# Patient Record
Sex: Male | Born: 1970 | Race: White | Hispanic: No | Smoking: Current every day smoker
Health system: Southern US, Community
[De-identification: ages and names within clinical notes are randomized; demographics above are authoritative.]

## PROBLEM LIST (undated history)

## (undated) DIAGNOSIS — M1990 Unspecified osteoarthritis, unspecified site: Secondary | ICD-10-CM

## (undated) DIAGNOSIS — F419 Anxiety disorder, unspecified: Secondary | ICD-10-CM

## (undated) HISTORY — PX: NO PAST SURGERIES: SHX2092

## (undated) HISTORY — DX: Anxiety disorder, unspecified: F41.9

## (undated) HISTORY — PX: JOINT REPLACEMENT: SHX530

---

## 2004-11-22 ENCOUNTER — Emergency Department: Payer: Self-pay | Admitting: Emergency Medicine

## 2006-05-19 ENCOUNTER — Other Ambulatory Visit: Payer: Self-pay

## 2006-05-19 ENCOUNTER — Inpatient Hospital Stay: Payer: Self-pay | Admitting: Internal Medicine

## 2006-05-20 ENCOUNTER — Other Ambulatory Visit: Payer: Self-pay

## 2006-05-21 ENCOUNTER — Other Ambulatory Visit: Payer: Self-pay

## 2006-05-23 ENCOUNTER — Ambulatory Visit: Payer: Self-pay | Admitting: Family Medicine

## 2007-08-16 ENCOUNTER — Ambulatory Visit: Payer: Self-pay | Admitting: Physician Assistant

## 2008-11-24 ENCOUNTER — Ambulatory Visit: Payer: Self-pay | Admitting: Family Medicine

## 2016-04-18 ENCOUNTER — Emergency Department: Payer: Self-pay

## 2016-04-18 ENCOUNTER — Emergency Department
Admission: EM | Admit: 2016-04-18 | Discharge: 2016-04-18 | Disposition: A | Discharge status: Home / Self Care | Payer: Self-pay | Attending: Emergency Medicine | Admitting: Emergency Medicine

## 2016-04-18 DIAGNOSIS — Y929 Unspecified place or not applicable: Secondary | ICD-10-CM | POA: Insufficient documentation

## 2016-04-18 DIAGNOSIS — F17210 Nicotine dependence, cigarettes, uncomplicated: Secondary | ICD-10-CM | POA: Insufficient documentation

## 2016-04-18 DIAGNOSIS — X501XXA Overexertion from prolonged static or awkward postures, initial encounter: Secondary | ICD-10-CM | POA: Insufficient documentation

## 2016-04-18 DIAGNOSIS — Y999 Unspecified external cause status: Secondary | ICD-10-CM | POA: Insufficient documentation

## 2016-04-18 DIAGNOSIS — S86911A Strain of unspecified muscle(s) and tendon(s) at lower leg level, right leg, initial encounter: Secondary | ICD-10-CM | POA: Insufficient documentation

## 2016-04-18 DIAGNOSIS — Y939 Activity, unspecified: Secondary | ICD-10-CM | POA: Insufficient documentation

## 2016-04-18 MED ORDER — ONDANSETRON 4 MG PO TBDP
4.0000 mg | ORAL_TABLET | Freq: Once | ORAL | Status: AC
  Administered 2016-04-18: 4 mg via ORAL
  Filled 2016-04-18: qty 1

## 2016-04-18 MED ORDER — IBUPROFEN 800 MG PO TABS: 800.0000 mg | ORAL_TABLET | Freq: Three times a day (TID) | ORAL | 0 refills | Status: DC | PRN

## 2016-04-18 MED ORDER — HYDROMORPHONE HCL 1 MG/ML IJ SOLN
1.0000 mg | Freq: Once | INTRAMUSCULAR | Status: AC
  Administered 2016-04-18: 1 mg via INTRAMUSCULAR
  Filled 2016-04-18: qty 1

## 2016-04-18 MED ORDER — HYDROCODONE-ACETAMINOPHEN 5-325 MG PO TABS: 1.0000 | ORAL_TABLET | ORAL | 0 refills | Status: DC | PRN

## 2016-04-18 NOTE — ED Notes (Signed)
See triage note. States he stepped in a hole yesterday and fell  Having pain to front of knee and into anterior lower leg

## 2016-04-18 NOTE — ED Provider Notes (Signed)
[facility] Emergency Department Provider Note  ____________________________________________  Time seen: Approximately 11:18 AM  I have reviewed the triage vital signs and the nursing notes.   HISTORY  Chief Complaint Knee Pain    HPI Sean Blake is a 45 y.o. male Libby Maw for evaluation of left knee pain. Patient states that he stepped in hole twisting his knee. Difficult time sleeping last night secondary to the pain. Scrubbed his pain is 10 over 10 at this time.   History reviewed. No pertinent past medical history.  There are no active problems to display for this patient.   History reviewed. No pertinent surgical history.  Prior to Admission medications   Medication Sig Start Date End Date Taking? Authorizing Provider  HYDROcodone-acetaminophen (NORCO) 5-325 MG tablet Take 1-2 tablets by mouth every 4 (four) hours as needed for moderate pain. 04/18/16   Pierce Crane Winslow Ederer, PA-C  ibuprofen (ADVIL,MOTRIN) 800 MG tablet Take 1 tablet (800 mg total) by mouth every 8 (eight) hours as needed. 04/18/16   Arlyss Repress, PA-C    Allergies Review of patient's allergies indicates no known allergies.  No family history on file.  Social History Social History  Substance Use Topics  . Smoking status: Current Every Day Smoker    Types: Cigarettes  . Smokeless tobacco: Never Used  . Alcohol use Yes    Review of Systems Constitutional: No fever/chills Cardiovascular: Denies chest pain. Respiratory: Denies shortness of breath. Musculoskeletal: Positive for left knee pain. Skin: Negative for rash. Neurological: Negative for headaches, focal weakness or numbness.  10-point ROS otherwise negative.  ____________________________________________   PHYSICAL EXAM:  VITAL SIGNS: ED Triage Vitals  Enc Vitals Group     BP 04/18/16 1036 130/74     Pulse Rate 04/18/16 1036 79     Resp 04/18/16 1036 18     Temp 04/18/16 1036 98.3 F (36.8 C)     Temp  Source 04/18/16 1036 Oral     SpO2 04/18/16 1036 98 %     Weight 04/18/16 1036 280 lb (127 kg)     Height 04/18/16 1036 6' (1.829 m)     Head Circumference --      Peak Flow --      Pain Score 04/18/16 1037 8     Pain Loc --      Pain Edu? --      Excl. in [place]? --     Constitutional: Alert and oriented. Well appearing and in no acute distress.  Cardiovascular: Normal rate, regular rhythm. Grossly normal heart sounds.  Good peripheral circulation. Respiratory: Normal respiratory effort.  No retractions. Lungs CTAB. Musculoskeletal: Positive for left knee tenderness. Limited range of motion. Increased pain with flexion. Distally neurovascularly intact. No ecchymosis or bruising noted no obvious effusion. Neurologic:  Normal speech and language. No gross focal neurologic deficits are appreciated. No gait instability. Skin:  Skin is warm, dry and intact. No rash noted. Psychiatric: Mood and affect are normal. Speech and behavior are normal.  ____________________________________________   LABS (all labs ordered are listed, but only abnormal results are displayed)  Labs Reviewed - No data to display ____________________________________________  EKG   ____________________________________________  RADIOLOGY  IMPRESSION:  Tricompartmental degenerative changes, advanced for age.    No acute fracture or joint effusion.    Suspect ossified loose bodies in the anterior joint space.    ____________________________________________   PROCEDURES  Procedure(s) performed: None  Critical Care performed: No  ____________________________________________   INITIAL IMPRESSION /  ASSESSMENT AND PLAN / ED COURSE  Pertinent labs & imaging results that were available during my care of the patient were reviewed by me and considered in my medical decision making (see chart for details). Review of the [place] CSRS was performed in accordance of the [place] prior to dispensing any controlled  drugs.  Acute left knee strain with severe osteoarthritis throughout the knee. Patient follow-up with orthopedics on call Rx given for ibuprofen 800 mg 3 times a day, and Norco and crutches. Recommend patient use ice and elevation and with PACs 24 hours.  Clinical Course    ____________________________________________   FINAL CLINICAL IMPRESSION(S) / ED DIAGNOSES  Final diagnoses:  Knee strain, right, initial encounter     This chart was dictated using voice recognition software/Dragon. Despite best efforts to proofread, errors can occur which can change the meaning. Any change was purely unintentional.    Arlyss Repress, PA-C 04/18/16 1209    Earleen Newport, MD 04/18/16 1359

## 2016-04-18 NOTE — ED Triage Notes (Signed)
Pt states he stepped in a hole yesterday and twisted his left knee.

## 2016-04-18 NOTE — Discharge Instructions (Signed)
Use your knee immobilizer at home.

## 2017-09-21 ENCOUNTER — Encounter (HOSPITAL_COMMUNITY): Payer: Self-pay | Admitting: Family Medicine

## 2017-09-21 ENCOUNTER — Ambulatory Visit (HOSPITAL_COMMUNITY)
Admission: EM | Admit: 2017-09-21 | Discharge: 2017-09-21 | Disposition: A | Discharge status: Home / Self Care | Payer: BLUE CROSS/BLUE SHIELD | Attending: Family Medicine | Admitting: Family Medicine

## 2017-09-21 ENCOUNTER — Ambulatory Visit (INDEPENDENT_AMBULATORY_CARE_PROVIDER_SITE_OTHER): Payer: BLUE CROSS/BLUE SHIELD

## 2017-09-21 DIAGNOSIS — M25562 Pain in left knee: Secondary | ICD-10-CM | POA: Diagnosis not present

## 2017-09-21 DIAGNOSIS — M1712 Unilateral primary osteoarthritis, left knee: Secondary | ICD-10-CM

## 2017-09-21 HISTORY — DX: Unspecified osteoarthritis, unspecified site: M19.90

## 2017-09-21 MED ORDER — DICLOFENAC SODIUM 75 MG PO TBEC: 75.0000 mg | DELAYED_RELEASE_TABLET | Freq: Two times a day (BID) | ORAL | 0 refills | Status: AC

## 2017-09-21 MED ORDER — HYDROCODONE-ACETAMINOPHEN 5-325 MG PO TABS: 1.0000 | ORAL_TABLET | Freq: Four times a day (QID) | ORAL | 0 refills | Status: DC | PRN

## 2017-09-21 NOTE — Discharge Instructions (Addendum)
Xray shows signs of worsening arthritis. Follow up with ortho for further management of arthritis.  Try taking diclofenac twice daily and see if you have any better relief compared to meloxicam. You may supplement with Tylenol. I have given a few days of Norco for you to use only for severe pain. Follow up with Dr. Scarlette Calico for continued pain medicine/Pain specialist for you knee injection.   Use ice and elevation to help with swelling. Use knee sleeve for support as needed.

## 2017-09-21 NOTE — ED Triage Notes (Signed)
Pt here for left knee pain x 2 weeks. Reports swelling and painful to walk and sleep. Hx of arthritis. No injury. Takes meloxicam for arthritis.

## 2017-09-21 NOTE — ED Provider Notes (Signed)
[place]    CSN: [id number] Arrival date & time: 09/21/17  1157     History   Chief Complaint Chief Complaint  Patient presents with  . Knee Pain    HPI Sean Blake is a 47 y.o. male presenting with acute flare of chronic knee pain. He has had knee pain from arthritis for years. He normally takes mobic for arthritis. Xray and 2017 showing tricompartmental degenerative changes, advanced for his age also some loose bodies.  Patient works in [employer] and has normal aches and pains in his knees.  Over the past 2 weeks he has had increasing swelling and pain.  Hurts to walk.  Had to leave work today due to pain.  His PCP is Dr. streets in [place] who has set him up with [facility].  He has seen them and plans to have a cortisone injection into his knee on February 15. denies any new recent injury to the knee.  Trying to elevate knee at home.  Meloxicam not providing much relief.  HPI  Past Medical History:  Diagnosis Date  . Arthritis     There are no active problems to display for this patient.   History reviewed. No pertinent surgical history.     Home Medications    Prior to Admission medications   Medication Sig Start Date End Date Taking? Authorizing Provider  diclofenac (VOLTAREN) 75 MG EC tablet Take 1 tablet (75 mg total) by mouth 2 (two) times daily for 12 days. 09/21/17 10/03/17  Xayla Puzio C, PA-C  HYDROcodone-acetaminophen (NORCO) 5-325 MG tablet Take 1-2 tablets by mouth every 6 (six) hours as needed for moderate pain. 09/21/17   Lucien Budney C, PA-C  ibuprofen (ADVIL,MOTRIN) 800 MG tablet Take 1 tablet (800 mg total) by mouth every 8 (eight) hours as needed. 04/18/16   Beers, Pierce Crane, PA-C    Family History History reviewed. No pertinent family history.  Social History Social History   Tobacco Use  . Smoking status: Current Every Day Smoker    Types: Cigarettes  . Smokeless tobacco: Never Used  Substance Use  Topics  . Alcohol use: Yes  . Drug use: Not on file     Allergies   Patient has no known allergies.   Review of Systems Review of Systems  Constitutional: Negative for fatigue and fever.  Respiratory: Negative for shortness of breath.   Cardiovascular: Negative for chest pain.  Gastrointestinal: Negative for nausea and vomiting.  Musculoskeletal: Positive for arthralgias, gait problem and joint swelling. Negative for back pain and myalgias.  Skin: Negative for wound.  Neurological: Negative for dizziness, light-headedness and headaches.     Physical Exam Triage Vital Signs ED Triage Vitals  Enc Vitals Group     BP 09/21/17 1225 (!) 147/89     Pulse Rate 09/21/17 1225 77     Resp 09/21/17 1225 18     Temp 09/21/17 1225 98 F (36.7 C)     Temp src --      SpO2 09/21/17 1225 97 %     Weight --      Height --      Head Circumference --      Peak Flow --      Pain Score 09/21/17 1224 9     Pain Loc --      Pain Edu? --      Excl. in [place]? --    No data found.  Updated Vital Signs BP (!) 147/89  Pulse 77   Temp 98 F (36.7 C)   Resp 18   SpO2 97%    Physical Exam  Constitutional: He appears well-developed and well-nourished.  HENT:  Head: Normocephalic and atraumatic.  Eyes: Conjunctivae are normal.  Neck: Neck supple.  Cardiovascular: Normal rate.  Pulmonary/Chest: Effort normal. No respiratory distress.  Musculoskeletal:  No obvious swelling or deformity, no erythema or warmth to touch.  Patient was full range of motion although with pain.     UC Treatments / Results  Labs (all labs ordered are listed, but only abnormal results are displayed) Labs Reviewed - No data to display  EKG  EKG Interpretation None       Radiology Dg Knee Complete 4 Views Left  Result Date: 09/21/2017 CLINICAL DATA:  47 year old male with 2 weeks of left knee pain, unable to bear weight. No known injury. Arthritis. Medial pain. EXAM: LEFT KNEE - COMPLETE 4+ VIEW  COMPARISON:  04/18/2016. FINDINGS: Supine views. Chronic calcified loose bodies along the anterior joint space near midline appear mildly larger than in 2017, individually estimated at 9 millimeters. Tricompartmental degenerative spurring is chronic. Medial joint space loss and subchondral heterogeneity has progressed. Possible patellofemoral compartment chondrocalcinosis, new since 2017. No joint effusion identified. No acute osseous abnormality identified. IMPRESSION: 1. Severe tricompartmental degenerative changes at the left knee have progressed since 2017. 2. 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 Chronic calcified loose bodies. Possible new patellofemoral compartment chondrocalcinosis. 3.  No acute osseous abnormality identified. Electronically Signed   By: Genevie Ann M.D.   On: 09/21/2017 12:45    Procedures Procedures (including critical care time)  Medications Ordered in UC Medications - No data to display   Initial Impression / Assessment and Plan / UC Course  I have reviewed the triage vital signs and the nursing notes.  Pertinent labs & imaging results that were available during my care of the patient were reviewed by me and considered in my medical decision making (see chart for details).     X-ray showing progressing arthritis plus osteochondral defects.  Advised patient to continue with cortisone injection on February 15.  Also advised to follow-up with orthopedics to start discussions on possible need for replacement versus continuation of injection therapy.  For pain today we will try changing to diclofenac versus the current Mobic.  Patient in significant pain will provide a few Norco for severe pain only.  Advised not to use at work as this will cause sedation.  Knee sleeve provided.  Work note provided  Final Clinical Impressions(s) / UC Diagnoses   Final diagnoses:  Osteoarthritis of left knee, unspecified osteoarthritis type    ED Discharge Orders         Ordered    HYDROcodone-acetaminophen (NORCO) 5-325 MG tablet  Every 6 hours PRN,   Status:  Discontinued     09/21/17 1316    diclofenac (VOLTAREN) 75 MG EC tablet  2 times daily     09/21/17 1316    HYDROcodone-acetaminophen (NORCO) 5-325 MG tablet  Every 6 hours PRN     09/21/17 1316       Controlled Substance Prescriptions [place] Controlled Substance Registry consulted? Yes, I have consulted the [place] Controlled Substances Registry for this patient, and feel the risk/benefit ratio today is favorable for proceeding with this prescription for a controlled substance.   Janith Lima, [place] 09/21/17 2130

## 2018-05-29 ENCOUNTER — Encounter (HOSPITAL_COMMUNITY): Payer: Self-pay | Admitting: Emergency Medicine

## 2018-05-29 ENCOUNTER — Emergency Department (HOSPITAL_COMMUNITY)
Admission: EM | Admit: 2018-05-29 | Discharge: 2018-05-29 | Disposition: A | Discharge status: Home / Self Care | Payer: BLUE CROSS/BLUE SHIELD | Attending: Emergency Medicine | Admitting: Emergency Medicine

## 2018-05-29 ENCOUNTER — Emergency Department (HOSPITAL_COMMUNITY): Payer: BLUE CROSS/BLUE SHIELD

## 2018-05-29 DIAGNOSIS — R1030 Lower abdominal pain, unspecified: Secondary | ICD-10-CM | POA: Diagnosis present

## 2018-05-29 DIAGNOSIS — F17210 Nicotine dependence, cigarettes, uncomplicated: Secondary | ICD-10-CM | POA: Diagnosis not present

## 2018-05-29 DIAGNOSIS — K5900 Constipation, unspecified: Secondary | ICD-10-CM | POA: Diagnosis not present

## 2018-05-29 LAB — URINALYSIS, ROUTINE W REFLEX MICROSCOPIC
Bilirubin Urine: NEGATIVE
Glucose, UA: NEGATIVE mg/dL
Leukocytes, UA: NEGATIVE
NITRITE: NEGATIVE
Protein, ur: 30 mg/dL — AB
SPECIFIC GRAVITY, URINE: 1.025 (ref 1.005–1.030)
pH: 6 (ref 5.0–8.0)

## 2018-05-29 LAB — CBC
HCT: 44.1 % (ref 39.0–52.0)
HEMOGLOBIN: 14.9 g/dL (ref 13.0–17.0)
MCH: 32 pg (ref 26.0–34.0)
MCHC: 33.8 g/dL (ref 30.0–36.0)
MCV: 94.6 fL (ref 78.0–100.0)
Platelets: 287 10*3/uL (ref 150–400)
RBC: 4.66 MIL/uL (ref 4.22–5.81)
RDW: 12.5 % (ref 11.5–15.5)
WBC: 5.8 10*3/uL (ref 4.0–10.5)

## 2018-05-29 LAB — COMPREHENSIVE METABOLIC PANEL
ALBUMIN: 3.5 g/dL (ref 3.5–5.0)
ALK PHOS: 54 U/L (ref 38–126)
ALT: 23 U/L (ref 0–44)
ANION GAP: 9 (ref 5–15)
AST: 23 U/L (ref 15–41)
BILIRUBIN TOTAL: 0.6 mg/dL (ref 0.3–1.2)
BUN: 13 mg/dL (ref 6–20)
CALCIUM: 9 mg/dL (ref 8.9–10.3)
CO2: 24 mmol/L (ref 22–32)
Chloride: 103 mmol/L (ref 98–111)
Creatinine, Ser: 0.83 mg/dL (ref 0.61–1.24)
GFR calc non Af Amer: >60 mL/min (ref >60)
Glucose, Bld: 97 mg/dL (ref 70–99)
Potassium: 3.4 mmol/L — ABNORMAL LOW (ref 3.5–5.1)
Sodium: 136 mmol/L (ref 135–145)
TOTAL PROTEIN: 6.6 g/dL (ref 6.5–8.1)

## 2018-05-29 LAB — URINALYSIS, MICROSCOPIC (REFLEX): BACTERIA UA: NONE SEEN

## 2018-05-29 LAB — POC OCCULT BLOOD, ED: FECAL OCCULT BLD: NEGATIVE

## 2018-05-29 LAB — LIPASE, BLOOD: LIPASE: 42 U/L (ref 11–51)

## 2018-05-29 MED ORDER — POLYETHYLENE GLYCOL 3350 17 G PO PACK: 17.0000 g | PACK | Freq: Every day | ORAL | 0 refills | Status: DC

## 2018-05-29 NOTE — ED Triage Notes (Signed)
Patient c/o abdominal pain states he has been having issues with his bowels over the past week. Was able to have a small bowel movement Saturday. Abdomen is slightly distended. Denies any N/V.

## 2018-05-29 NOTE — ED Provider Notes (Signed)
[place] [facility] EMERGENCY DEPARTMENT Provider Note   CSN: [id number] Arrival date & time: 05/29/18  1016     History   Chief Complaint Chief Complaint  Patient presents with  . Constipation    HPI Sean Blake is a 47 y.o. male.  The history is provided by the patient. No language interpreter was used.  Constipation   This is a new problem. Associated symptoms include abdominal pain. He does not exercise regularly. There has been adequate water intake. He has tried nothing for the symptoms. The treatment provided no relief. Risk factors include immobility.  Pt reports he has bilat knee problems.  Pt states some decrease in activity.  He is on meloxicam.  Pt reports he is constipated.  Pt saw some blood with trying to have a bowel movement.  Pt concerned about blood.   Past Medical History:  Diagnosis Date  . Arthritis     There are no active problems to display for this patient.   History reviewed. No pertinent surgical history.      Home Medications    Prior to Admission medications   Medication Sig Start Date End Date Taking? Authorizing Provider  meloxicam (MOBIC) 15 MG tablet Take 15 mg by mouth daily. 05/28/18  Yes [provider]  polyethylene glycol (MIRALAX) packet Take 17 g by mouth daily. 05/29/18   Fransico Meadow, PA-C    Family History No family history on file.  Social History Social History   Tobacco Use  . Smoking status: Current Every Day Smoker    Types: Cigarettes  . Smokeless tobacco: Never Used  Substance Use Topics  . Alcohol use: Yes  . Drug use: Not on file     Allergies   Patient has no known allergies.   Review of Systems Review of Systems  Gastrointestinal: Positive for abdominal pain and constipation.  All other systems reviewed and are negative.    Physical Exam Updated Vital Signs BP (!) 141/87 (BP Location: Right Arm)   Pulse 84   Temp 98.4 F (36.9 C) (Oral)   Resp 16   Ht 6' (1.829  m)   Wt 126.1 kg   SpO2 99%   BMI 37.70 kg/m   Physical Exam  Constitutional: He appears well-developed and well-nourished.  HENT:  Head: Normocephalic and atraumatic.  Eyes: Conjunctivae are normal.  Neck: Neck supple.  Cardiovascular: Normal rate and regular rhythm.  No murmur heard. Pulmonary/Chest: Effort normal and breath sounds normal. No respiratory distress.  Abdominal: Soft. There is tenderness.  Musculoskeletal: He exhibits no edema.  Neurological: He is alert.  Skin: Skin is warm and dry.  Psychiatric: He has a normal mood and affect.  Nursing note and vitals reviewed.    ED Treatments / Results  Labs (all labs ordered are listed, but only abnormal results are displayed) Labs Reviewed  COMPREHENSIVE METABOLIC PANEL - Abnormal; Notable for the following components:      Result Value   Potassium 3.4 (*)    All other components within normal limits  URINALYSIS, ROUTINE W REFLEX MICROSCOPIC - Abnormal; Notable for the following components:   Color, Urine YELLOW (*)    APPearance CLEAR (*)    Hgb urine dipstick TRACE (*)    Ketones, ur TRACE (*)    Protein, ur 30 (*)    All other components within normal limits  LIPASE, BLOOD  CBC  URINALYSIS, MICROSCOPIC (REFLEX)  POCT GASTRIC OCCULT BLOOD (1-CARD TO LAB)  POC OCCULT BLOOD,  ED    EKG None  Radiology Dg Abdomen 1 View  Result Date: 05/29/2018 CLINICAL DATA:  Bilateral lower quadrant pain for 1 week EXAM: ABDOMEN - 1 VIEW COMPARISON:  None. FINDINGS: Scattered large and small bowel gas is noted. No obstructive changes are seen. No abnormal mass or abnormal calcifications are noted. Mild degenerative changes of the lumbar spine are noted. IMPRESSION: No acute abnormality seen. Electronically Signed   By: Inez Catalina M.D.   On: 05/29/2018 12:51    Procedures Procedures (including critical care time)  Medications Ordered in ED Medications - No data to display   Initial Impression / Assessment and Plan  / ED Course  I have reviewed the triage vital signs and the nursing notes.  Pertinent labs & imaging results that were available during my care of the patient were reviewed by me and considered in my medical decision making (see chart for details).  Clinical Course as of May 29 1930  Tue May 29, 2018  1639 Urinalysis, Routine w reflex microscopic [LS]    Clinical Course User Index [LS] Fransico Meadow, [place]    MDM.  Pt given rx for miralax.  Pt advised to exercise.    Final Clinical Impressions(s) / ED Diagnoses   Final diagnoses:  Constipation, unspecified constipation type  Lower abdominal pain    ED Discharge Orders         Ordered    polyethylene glycol (MIRALAX) packet  Daily     05/29/18 1708        An After Visit Summary was printed and given to the patient.    Fransico Meadow, PA-C 05/29/18 [place], [place], DO 05/29/18 2332

## 2018-05-29 NOTE — Discharge Instructions (Signed)
Return if any problems.

## 2018-05-29 NOTE — ED Notes (Signed)
Patient verbalizes understanding of discharge instructions. Opportunity for questioning and answers were provided. Ambulatory at discharge in NAD.  

## 2018-05-29 NOTE — ED Notes (Signed)
Patient transported to X-ray 

## 2021-04-13 ENCOUNTER — Ambulatory Visit (INDEPENDENT_AMBULATORY_CARE_PROVIDER_SITE_OTHER): Payer: Self-pay | Admitting: Family Medicine

## 2021-04-13 ENCOUNTER — Encounter: Payer: Self-pay | Admitting: Family Medicine

## 2021-04-13 ENCOUNTER — Other Ambulatory Visit: Payer: Self-pay

## 2021-04-13 VITALS — BP 149/97 | HR 99 | Ht 72.0 in | Wt 323.4 lb

## 2021-04-13 DIAGNOSIS — M159 Polyosteoarthritis, unspecified: Secondary | ICD-10-CM

## 2021-04-13 DIAGNOSIS — M8949 Other hypertrophic osteoarthropathy, multiple sites: Secondary | ICD-10-CM

## 2021-04-13 DIAGNOSIS — Z7689 Persons encountering health services in other specified circumstances: Secondary | ICD-10-CM

## 2021-04-13 DIAGNOSIS — R221 Localized swelling, mass and lump, neck: Secondary | ICD-10-CM

## 2021-04-13 DIAGNOSIS — I10 Essential (primary) hypertension: Secondary | ICD-10-CM

## 2021-04-13 MED ORDER — MELOXICAM 15 MG PO TABS: 15.0000 mg | ORAL_TABLET | Freq: Every day | ORAL | 2 refills | Status: DC | PRN

## 2021-04-13 NOTE — Patient Instructions (Addendum)
Thank you for coming to the office today.  We will contact our social work / case management team to help and locate options for you - may need charity care options.  Noreene Larsson RN and [name] Social Worker will contact you - stay tuned.  START anti inflammatory topical - OTC Voltaren (generic Diclofenac) topical 2-4 times a day as needed for pain swelling of affected joint for 1-2 weeks or longer.  Recommend to start taking Tylenol Extra Strength '500mg'$  tabs - take 1 to 2 tabs per dose (max '1000mg'$ ) every 6-8 hours for pain (take regularly, don't skip a dose for next 7 days), max 24 hour daily dose is 6 tablets or '3000mg'$ . In the future you can repeat the same everyday Tylenol course for 1-2 weeks at a time.   [facility] Address: [address] [place], [place] [address] Phone: [phone]  Call up [facility] drew to get in as new patient if possible while we wait  Please schedule a Follow-up Appointment to: Return in about 4 weeks (around 05/11/2021), or if symptoms worsen or fail to improve.  If you have any other questions or concerns, please feel free to call the office or send a message through [place]. You may also schedule an earlier appointment if necessary.  Additionally, you may be receiving a survey about your experience at our office within a few days to 1 week by e-mail or mail. We value your feedback.  Nobie Putnam, DO [facility]
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
## 2021-04-14 ENCOUNTER — Telehealth: Payer: Self-pay

## 2021-04-14 NOTE — Chronic Care Management (AMB) (Signed)
  Care Management   Note  04/14/2021 Name: [name] MRN: [id number] DOB: [birth year]  Sean Blake is a 50 y.o. year old male who is a primary care patient of [name], DO. I reached out to [employer] by phone today in response to a referral sent by Mr. Sean Leavens [employer] health plan.    Mr. Barriga was given information about care management services today including:  Care management services include personalized support from designated clinical staff supervised by his physician, including individualized plan of care and coordination with other care providers 24/7 contact phone numbers for assistance for urgent and routine care needs. The patient may stop care management services at any time by phone call to the office staff.  Patient agreed to services and verbal consent obtained.   Follow up plan: Telephone appointment with care management team member scheduled for: RN CM 04/22/2021 LCSW 04/27/2021  Noreene Larsson, [place], [place], [place] [address] Direct Dial: [phone] [email or web address]: [email or web address]

## 2021-04-15 ENCOUNTER — Ambulatory Visit: Payer: Self-pay | Admitting: General Practice

## 2021-04-15 DIAGNOSIS — R296 Repeated falls: Secondary | ICD-10-CM

## 2021-04-15 DIAGNOSIS — F4321 Adjustment disorder with depressed mood: Secondary | ICD-10-CM

## 2021-04-15 DIAGNOSIS — I10 Essential (primary) hypertension: Secondary | ICD-10-CM

## 2021-04-15 DIAGNOSIS — F32A Depression, unspecified: Secondary | ICD-10-CM

## 2021-04-15 DIAGNOSIS — M8949 Other hypertrophic osteoarthropathy, multiple sites: Secondary | ICD-10-CM

## 2021-04-15 DIAGNOSIS — F17200 Nicotine dependence, unspecified, uncomplicated: Secondary | ICD-10-CM

## 2021-04-15 DIAGNOSIS — M159 Polyosteoarthritis, unspecified: Secondary | ICD-10-CM

## 2021-04-15 DIAGNOSIS — F419 Anxiety disorder, unspecified: Secondary | ICD-10-CM

## 2021-04-15 DIAGNOSIS — R221 Localized swelling, mass and lump, neck: Secondary | ICD-10-CM

## 2021-04-15 NOTE — Patient Instructions (Signed)
Visit Information  PATIENT GOALS:   Goals Addressed             This Visit's Progress    RNCM: Find Help in My Community       Timeframe:  Short-Term Goal Priority:  High Start Date:       04-15-2021                      Expected End Date:     08-17-2021                  Follow Up Date 04/22/2021    - begin a notebook of services in my neighborhood or community - call 211 when I need some help - follow-up on any referrals for help I am given - think ahead to make sure my need does not become an emergency - make a note about what I need to have by the phone or take with me, like an identification card or social security number have a back-up plan - have a back-up plan - make a list of family or friends that I can call    Why is this important?   Knowing how and where to find help for yourself or family in your neighborhood and community is an important skill.  You will want to take some steps to learn how.    Notes: [phone]: CAFA application sent to the patient today by email at [email or web address], careguide referral made for SDOH, will ask the pharm D to assist with medications assistance. LCSW to follow up with the patient on expressed needs. Will continue to work with the patient to get needed resources to help with chronic conditions.      RNCM: Lifestyle Change-Hypertension       Timeframe:  Long-Range Goal Priority:  High Start Date:   04-15-2021                          Expected End Date:    04-15-2022                   Follow Up Date 04/22/2022    - agree on reward when goals are met - agree to work together to make changes - ask questions to understand - have a family meeting to talk about healthy habits - learn about high blood pressure    Why is this important?   The changes that you are asked to make may be hard to do.  This is especially true when the changes are life-long.  Knowing why it is important to you is the first step.  Working on the change with  your family or support person helps you not feel alone.  Reward yourself and family or support person when goals are met. This can be an activity you choose like bowling, hiking, biking, swimming or shooting hoops.     Notes: 04-15-2021: The patient is currently not on any blood pressure medications. Established care this week with pcp. Expected follow up x one month     RNCM: Make and Keep All Appointments       Timeframe:  Long-Range Goal Priority:  High Start Date:       04-15-2021                      Expected End Date:      04-15-2022  Follow Up Date 04/22/2021    - call to cancel if needed - keep a calendar with prescription refill dates - keep a calendar with appointment dates    Why is this important?   Part of staying healthy is seeing the doctor for follow-up care.  If you forget your appointments, there are some things you can do to stay on track.    Notes: 04-15-2021: The patient has a truck and can get to his appointments.      RNCM: Manage My Emotions       Timeframe:  Long-Range Goal Priority:  High Start Date:     04-15-2021                        Expected End Date:   04-15-2022                    Follow Up Date 04/22/2021    - call and visit an old friend - join a support group - laugh; watch a funny movie or comedian - learn and use visualization or guided imagery - perform a random act of kindness - practice relaxation or meditation daily - talk about feelings with a friend, family or spiritual advisor - practice positive thinking and self-talk    Why is this important?   When you are stressed, down or upset, your body reacts too.  For example, your blood pressure may get higher; you may have a headache or stomachache.  When your emotions get the best of you, your body's ability to fight off cold and flu gets weak.  These steps will help you manage your emotions.     Notes: 04-15-2021: The patient has a lot of stressors in his life right now. Has  had a lot of grief and loss over the last couple of years. His son died in 19-Nov-2018 and in a 6 month period of time he lost a best friend, the best friends son, his father.  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 Has moved back to [place]. Has a lot of health factors impacting his care. Is willing to work with the CCM team for support. Resources given and talked with the patient about griefshare.org for grief support and education.      RNCM: Manage My Medicine       Timeframe:  Long-Range Goal Priority:  High Start Date:       04-15-2021                      Expected End Date:        04-15-2022               Follow Up Date 04/22/2021    - call for medicine refill 2 or 3 days before it runs out - call if I am sick and can't take my medicine - keep a list of all the medicines I take; vitamins and herbals too - learn to read medicine labels - use a pillbox to sort medicine - use an alarm clock or phone to remind me to take my medicine    Why is this important?   These steps will help you keep on track with your medicines.   Notes: 04-15-2021: The patient states he has a hard time paying for his medications. The patient has a script for Meloxicam. Will do a referral to pharm D  for assistance with medication management clinic. Will continue to monitor.      RNCM: Pain control       Timeframe:  Long-Range Goal Priority:  High Start Date:  04-15-2021                           Expected End Date:    04-15-2022                   Follow Up Date 04/22/2021    - call for medicine refill 2 or 3 days before it runs out - develop a personal pain management plan - keep track of prescription refills - plan exercise or activity when pain is best controlled - prioritize tasks for the day - stay active    Why is this important?   Day-to-day life can be hard when you have back pain.  Pain medicine is just one piece of the treatment puzzle. There are  many things you can do to manage pain and keep your back strong.   Lifestyle changes, like stopping smoking and eating foods with Vitamin D and calcium, keep your bones and muscles healthy. Your back is better when it is supported by strong muscles.  You can try these action steps to help you manage your pain.     Notes: 04-15-2021: Has chronic osteoarthritis pain. Takes Meloxicam      RNCM: Prevent Falls and Injury       Follow Up Date 04/22/2021    - add more outdoor lighting - always use handrails on the stairs - always wear low-heeled or flat shoes or slippers with nonskid soles - call the doctor if I am feeling too drowsy - install bathroom grab bars - join an exercise group in my community - keep a flashlight by the bed - keep my cell phone with me always - learn how to get back up if I fall - make an emergency alert plan in case I fall - pick up clutter from the floors - use a nonslip pad with throw rugs, or remove them completely - use a nightlight in the bathroom    Why is this important?   Most falls happen when it is hard for you to walk safely. Your balance may be off because of an illness. You may have pain in your knees, hip or other joints.  You may be overly tired or taking medicines that make you sleepy. You may not be able to see or hear clearly.  Falls can lead to broken bones, bruises or other injuries.  There are things you can do to help prevent falling.     Notes: 04-15-2021: The patient has frequent falls, mainly due to pain and discomfort from osteoarthritis.         Sean Blake was given information about Care Management services by the embedded care coordination team including:  Care Management services include personalized support from designated clinical staff supervised by his physician, including individualized plan of care and coordination with other care providers 24/7 contact phone numbers for assistance for urgent and routine care needs. The patient  may stop CCM services at any time (effective at the end of the month) by phone call to the office staff.  Patient agreed to services and verbal consent obtained.   Patient verbalizes understanding of instructions provided today and agrees to view in [place].   Telephone follow up appointment with care management team member scheduled for:  04-22-2021 at 1 pm  [place], MSN, [facility] Mobile: [phone]  Complicated Grief Grief is a normal response to the death of someone close to you. Feelings of fear, anger, and guilt can affect almost everyone who loses a loved one. It is also common to have symptoms of depression while you are grieving. These include problems with sleep, loss of appetite, and lack of energy. They maylast for weeks or months after a loss. Complicated grief is different from normal grief or depression. Normal grieving involves sadness and feelings of loss, but those feelings get better and heal over time. Complicated grief is a severe type of grief that lasts for a long time, usually for several months to a year or longer. It interferes with your ability to function normally. Complicated grief may require treatment from [facility] health care provider. What are the causes? The cause of this condition is not known. It is not clear why some peoplecontinue to struggle with grief and others do not. What increases the risk? You are more likely to develop this condition if: The death of your loved one was sudden or unexpected. The death of your loved one was due to a violent event. Your loved one died from suicide. Your loved one was a child or a young person. You were very close to your loved one, or you were dependent on him or her. You have a history of depression or anxiety. What are the signs or symptoms? Symptoms of this condition include: Feeling disbelief or having a lack of emotion  (numbness). Being unable to enjoy good memories of your loved one. Needing to avoid anything or anyone that reminds you of your loved one. Being unable to stop thinking about the death. Feeling intense anger or guilt. Feeling alone and hopeless. Feeling that your life is meaningless and empty. Losing the desire to move on with your life. How is this diagnosed? This condition may be diagnosed based on: Your symptoms. Complicated grief will be diagnosed if you have ongoing symptoms of grief for 6-12 months or longer. The effect of symptoms on your life. You may be diagnosed with this condition if your symptoms are interfering with your ability to live your life. Your health care provider may recommend that you see a mental health care provider. Many symptoms of depression are similar to the symptoms of complicated grief. It is important to be evaluated for complicated grief alongwith other mental health conditions. How is this treated? This condition is most commonly treated with talk therapy. This therapy is offered by a mental health specialist (psychiatrist). During therapy: You will learn healthy ways to cope with the loss of your loved one. Your mental health care provider may recommend antidepressant medicines. Follow these instructions at home: Lifestyle  Take care of yourself. Eat on a regular basis, and maintain a healthy diet. Eat plenty of fruits, vegetables, lean protein, and whole grains. Try to get some exercise each day. Aim for 30 minutes of exercise on most days of the week. Keep a consistent sleep schedule. Try to get 8 or more hours of sleep each night. Start doing the things that you used to enjoy. Do not use drugs or alcohol to ease your symptoms. Spend time with friends and loved ones.  General instructions Take over-the-counter and prescription medicines only as told by your health care provider. Consider joining a grief (bereavement) support group to help you deal  with your loss. Keep all follow-up visits as told by your health care provider. This is important. Contact a health care provider if: Your symptoms prevent you from functioning normally. Your symptoms do not get better with treatment. Get help right away if: You have serious thoughts about hurting yourself or someone else. You have suicidal feelings. If you ever feel like you may hurt yourself or others, or have thoughts about taking your own life, get help right away. You can go to your nearest emergency department or call: Your local emergency services (911 in the [place] A suicide crisis helpline, such as the [facility] at (519)764-6172. This is open 24 hours a day. Summary Complicated grief is a severe type of grief that lasts for a long time. This grief is not likely to go away on its own. Get the help you need. Some griefs are more difficult than others and can cause this condition. You may need a certain type of treatment to help you recover if the loss of your loved one was sudden, violent, or due to suicide. You may feel guilty about moving on with your life. Getting help does not mean that you are forgetting your loved one. It means that you are taking care of yourself. Complicated grief is best treated with talk therapy. Medicines may also be prescribed. Seek the help you need, and find support that will help you recover. This information is not intended to replace advice given to you by your health care provider. Make sure you discuss any questions you have with your healthcare provider. Document Revised: 02/06/2020 Document Reviewed: 02/06/2020 Elsevier Patient Education  [place]. [email or web address]">  DASH Eating Plan DASH stands for Dietary Approaches to Stop Hypertension. The DASH eating plan is a healthy eating plan that has been shown to: Reduce high blood pressure (hypertension). Reduce your  risk for type 2 diabetes, heart disease, and stroke. Help with weight loss. What are tips for following this plan? Reading food labels Check food labels for the amount of salt (sodium) per serving. Choose foods with less than 5 percent of the Daily Value of sodium. Generally, foods with less than 300 milligrams (mg) of sodium per serving fit into this eating plan. To find whole grains, look for the word "whole" as the first word in the ingredient list. Shopping Buy products labeled as "low-sodium" or "no salt added." Buy fresh foods. Avoid canned foods and pre-made or frozen meals. Cooking Avoid adding salt when cooking. Use salt-free seasonings or herbs instead of table salt or sea salt. Check with your health care provider or pharmacist before using salt substitutes. Do not fry foods. Cook foods using healthy methods such as baking, boiling, grilling, roasting, and broiling instead. Cook with heart-healthy oils, such as olive, canola, avocado, soybean, or sunflower oil. Meal planning  Eat a balanced diet that includes: 4 or more servings of fruits and 4 or more servings of vegetables each day. Try to fill one-half of your plate with fruits and vegetables. 6-8 servings of whole grains each day. Less than 6 oz (170 g) of lean meat, poultry, or fish each day. A 3-oz (85-g) serving of meat is about the same size as a deck of cards. One egg equals 1 oz (28 g). 2-3 servings of low-fat dairy each day. One serving is 1 cup (237 mL). 1 serving of nuts, seeds, or beans 5 times each week. 2-3 servings of heart-healthy fats. Healthy fats called omega-3 fatty acids are found  in foods such as walnuts, flaxseeds, fortified milks, and eggs. These fats are also found in cold-water fish, such as sardines, salmon, and mackerel. Limit how much you eat of: Canned or prepackaged foods. Food that is high in trans fat, such as some fried foods. Food that is high in saturated fat, such as fatty meat. Desserts and  other sweets, sugary drinks, and other foods with added sugar. Full-fat dairy products. Do not salt foods before eating. Do not eat more than 4 egg yolks a week. Try to eat at least 2 vegetarian meals a week. Eat more home-cooked food and less restaurant, buffet, and fast food.  Lifestyle When eating at a restaurant, ask that your food be prepared with less salt or no salt, if possible. If you drink alcohol: Limit how much you use to: 0-1 drink a day for women who are not pregnant. 0-2 drinks a day for men. Be aware of how much alcohol is in your drink. In the [place] one drink equals one 12 oz bottle of beer (355 mL), one 5 oz glass of wine (148 mL), or one 1 oz glass of hard liquor (44 mL). General information Avoid eating more than 2,300 mg of salt a day. If you have hypertension, you may need to reduce your sodium intake to 1,500 mg a day. Work with your health care provider to maintain a healthy body weight or to lose weight. Ask what an ideal weight is for you. Get at least 30 minutes of exercise that causes your heart to beat faster (aerobic exercise) most days of the week. Activities may include walking, swimming, or biking. Work with your health care provider or dietitian to adjust your eating plan to your individual calorie needs. What foods should I eat? Fruits All fresh, dried, or frozen fruit. Canned fruit in natural juice (without addedsugar). Vegetables Fresh or frozen vegetables (raw, steamed, roasted, or grilled). Low-sodium or reduced-sodium tomato and vegetable juice. Low-sodium or reduced-sodium tomatosauce and tomato paste. Low-sodium or reduced-sodium canned vegetables. Grains Whole-grain or whole-wheat bread. Whole-grain or whole-wheat pasta. Brown rice. [place]. Bulgur. Whole-grain and low-sodium cereals. Pita bread.Low-fat, low-sodium crackers. Whole-wheat flour tortillas. Meats and other proteins Skinless chicken or [place]. Ground chicken or [place]. Pork  with fat trimmed off. Fish and seafood. Egg whites. Dried beans, peas, or lentils. Unsalted nuts, nut butters, and seeds. Unsalted canned beans. Lean cuts of beef with fat trimmed off. Low-sodium, lean precooked or cured meat, such as sausages or meatloaves. Dairy Low-fat (1%) or fat-free (skim) milk. Reduced-fat, low-fat, or fat-free cheeses. Nonfat, low-sodium ricotta or cottage cheese. Low-fat or nonfatyogurt. Low-fat, low-sodium cheese. Fats and oils Soft margarine without trans fats. Vegetable oil. Reduced-fat, low-fat, or light mayonnaise and salad dressings (reduced-sodium). Canola, safflower, olive, avocado, soybean, andsunflower oils. Avocado. Seasonings and condiments Herbs. Spices. Seasoning mixes without salt. Other foods Unsalted popcorn and pretzels. Fat-free sweets. The items listed above may not be a complete list of foods and beverages you can eat. Contact a dietitian for more information. What foods should I avoid? Fruits Canned fruit in a light or heavy syrup. Fried fruit. Fruit in cream or buttersauce. Vegetables Creamed or fried vegetables. Vegetables in a cheese sauce. Regular canned vegetables (not low-sodium or reduced-sodium). Regular canned tomato sauce and paste (not low-sodium or reduced-sodium). Regular tomato and vegetable juice(not low-sodium or reduced-sodium). Angie Fava. Olives. Grains Baked goods made with fat, such as croissants, muffins, or some breads. Drypasta or rice meal packs. Meats and other proteins Fatty cuts  of meat. Ribs. Fried meat. Berniece Salines. Bologna, salami, and other precooked or cured meats, such as sausages or meat loaves. Fat from the back of a pig (fatback). Bratwurst. Salted nuts and seeds. Canned beans with added salt. Canned orsmoked fish. Whole eggs or egg yolks. Chicken or [place] with skin. Dairy Whole or 2% milk, cream, and half-and-half. Whole or full-fat cream cheese. Whole-fat or sweetened yogurt. Full-fat cheese. Nondairy creamers.  Whippedtoppings. Processed cheese and cheese spreads. Fats and oils Butter. Stick margarine. Lard. Shortening. Ghee. Bacon fat. Tropical oils, suchas coconut, palm kernel, or palm oil. Seasonings and condiments Onion salt, garlic salt, seasoned salt, table salt, and sea salt. Worcestershire sauce. Tartar sauce. Barbecue sauce. Teriyaki sauce. Soy sauce, including reduced-sodium. Steak sauce. Canned and packaged gravies. Fish sauce. Oyster sauce. Cocktail sauce. Store-bought horseradish. Ketchup. Mustard. Meat flavorings and tenderizers. Bouillon cubes. Hot sauces. Pre-made or packaged marinades. Pre-made or packaged taco seasonings. Relishes. Regular saladdressings. Other foods Salted popcorn and pretzels. The items listed above may not be a complete list of foods and beverages you should avoid. Contact a dietitian for more information. Where to find more information National Heart, Lung, and Blood Institute: https://wilson-eaton.com/ American Heart Association: www.heart.org Academy of Nutrition and Dietetics: www.eatright.[place]: www.kidney.org Summary The DASH eating plan is a healthy eating plan that has been shown to reduce high blood pressure (hypertension). It may also reduce your risk for type 2 diabetes, heart disease, and stroke. When on the DASH eating plan, aim to eat more fresh fruits and vegetables, whole grains, lean proteins, low-fat dairy, and heart-healthy fats. With the DASH eating plan, you should limit salt (sodium) intake to 2,300 mg a day. If you have hypertension, you may need to reduce your sodium intake to 1,500 mg a day. Work with your health care provider or dietitian to adjust your eating plan to your individual calorie needs. This information is not intended to replace advice given to you by your health care provider. Make sure you discuss any questions you have with your healthcare provider. Document Revised: 07/19/2019 Document Reviewed:  07/19/2019 Elsevier Patient Education  2022 [place]. http://APA.org/depression-guideline"> https://clinicalkey.com"> http://point-of-care.elsevierperformancemanager.com/skills/"> http://point-of-care.elsevierperformancemanager.com">  Managing Depression, Adult Depression is a mental health condition that affects your thoughts, feelings, and actions. Being diagnosed with depression can bring you relief if you did not know why you have felt or behaved a certain way. It could also leave you feeling overwhelmed with uncertainty about your future. Preparing yourself tomanage your symptoms can help you feel more positive about your future. How to manage lifestyle changes Managing stress  Stress is your body's reaction to life changes and events, both good and bad. Stress can add to your feelings of depression. Learning to manage your stresscan help lessen your feelings of depression. Try some of the following approaches to reducing your stress (stress reduction techniques): Listen to music that you enjoy and that inspires you. Try using a meditation app or take a meditation class. Develop a practice that helps you connect with your spiritual self. Walk in nature, pray, or go to a place of worship. Do some deep breathing. To do this, inhale slowly through your nose. Pause at the top of your inhale for a few seconds and then exhale slowly, letting your muscles relax. Practice yoga to help relax and work your muscles. Choose a stress reduction technique that suits your lifestyle and personality. These techniques take time and practice to develop. Set aside 5-15 minutes a day to do them. Therapists  can offer training in these techniques. Other things you can do to manage stress include: Keeping a stress diary. Knowing your limits and saying no when you think something is too much. Paying attention to how you react to certain situations. You may not be able to control everything, but you can change your  reaction. Adding humor to your life by watching funny films or TV shows. Making time for activities that you enjoy and that relax you.  Medicines Medicines, such as antidepressants, are often a part of treatment for depression. Talk with your pharmacist or health care provider about all the medicines, supplements, and herbal products that you take, their possible side effects, and what medicines and other products are safe to take together. Make sure to report any side effects you may have to your health care provider. Relationships Your health care provider may suggest family therapy, couples therapy, orindividual therapy as part of your treatment. How to recognize changes Everyone responds differently to treatment for depression. As you recover from depression, you may start to: Have more interest in doing activities. Feel less hopeless. Have more energy. Overeat less often, or have a better appetite. Have better mental focus. It is important to recognize if your depression is not getting better or is getting worse. The symptoms you had in the beginning may return, such as: Tiredness (fatigue) or low energy. Eating too much or too little. Sleeping too much or too little. Feeling restless, agitated, or hopeless. Trouble focusing or making decisions. Unexplained physical complaints. Feeling irritable, angry, or aggressive. If you or your family members notice these symptoms coming back, let yourhealth care provider know right away. Follow these instructions at home: Activity  Try to get some form of exercise each day, such as walking, biking, swimming, or lifting weights. Practice stress reduction techniques. Engage your mind by taking a class or doing some volunteer work.  Lifestyle Get the right amount and quality of sleep. Cut down on using caffeine, tobacco, alcohol, and other potentially harmful substances. Eat a healthy diet that includes plenty of vegetables, fruits, whole  grains, low-fat dairy products, and lean protein. Do not eat a lot of foods that are high in solid fats, added sugars, or salt (sodium). General instructions Take over-the-counter and prescription medicines only as told by your health care provider. Keep all follow-up visits as told by your health care provider. This is important. Where to find support Talking to others  Friends and family members can be sources of support and guidance. Talk to trusted friends or family members about your condition. Explain your symptoms to them, and let them know that you are working with a health care provider to treat your depression. Tell friends and family members how they also can behelpful. Finances Find appropriate mental health providers that fit with your financial situation. Talk with your health care provider about options to get reduced prices on your medicines. Where to find more information You can find support in your area from: Anxiety and Depression Association of America (ADAA): www.adaa.org Mental Health America: www.mentalhealthamerica.net [employer] on Mental Illness: www.nami.org Contact a health care provider if: You stop taking your antidepressant medicines, and you have any of these symptoms: Nausea. Headache. Light-headedness. Chills and body aches. Not being able to sleep (insomnia). You or your friends and family think your depression is getting worse. Get help right away if: You have thoughts of hurting yourself or others. If you ever feel like you may hurt yourself or others, or  have thoughts about taking your own life, get help right away. Go to your nearest emergency department or: Call your local emergency services (911 in the [place] Call a suicide crisis helpline, such as the [facility] at (807)749-1937. This is open 24 hours a day in the [place] Text the Crisis Text Line at (307) 429-9233 (in the [place].). Summary If you are diagnosed with  depression, preparing yourself to manage your symptoms is a good way to feel positive about your future. Work with your health care provider on a management plan that includes stress reduction techniques, medicines (if applicable), therapy, and healthy lifestyle habits. Keep talking with your health care provider about how your treatment is working. If you have thoughts about taking your own life, call a suicide crisis helpline or text a crisis text line. This information is not intended to replace advice given to you by your health care provider. Make sure you discuss any questions you have with your healthcare provider. Document Revised: 06/26/2019 Document Reviewed: 06/26/2019 Elsevier Patient Education  2022 [place]. Steps to Quit Smoking Smoking tobacco is the leading cause of preventable death. It can affect almost every organ in the body. Smoking puts you and people around you at risk for many serious, long-lasting (chronic) diseases. Quitting smoking can be hard, but it is one of the best things thatyou can do for your health. It is never too late to quit. How do I get ready to quit? When you decide to quit smoking, make a plan to help you succeed. Before you quit: Pick a date to quit. Set a date within the next 2 weeks to give you time to prepare. Write down the reasons why you are quitting. Keep this list in places where you will see it often. Tell your family, friends, and co-workers that you are quitting. Their support is important. Talk with your doctor about the choices that may help you quit. Find out if your health insurance will pay for these treatments. Know the people, places, things, and activities that make you want to smoke (triggers). Avoid them. What first steps can I take to quit smoking? Throw away all cigarettes at home, at work, and in your car. Throw away the things that you use when you smoke, such as ashtrays and lighters. Clean your car. Make sure to empty  the ashtray. Clean your home, including curtains and carpets. What can I do to help me quit smoking? Talk with your doctor about taking medicines and seeing a counselor at the same time. You are more likely to succeed when you do both. If you are pregnant or breastfeeding, talk with your doctor about counseling or other ways to quit smoking. Do not take medicine to help you quit smoking unless your doctor tells you to do so. To quit smoking: Quit right away Quit smoking totally, instead of slowly cutting back on how much you smoke over a period of time. Go to counseling. You are more likely to quit if you go to counseling sessions regularly. Take medicine You may take medicines to help you quit. Some medicines need a prescription, and some you can buy over-the-counter. Some medicines may contain a drug called nicotine to replace the nicotine in cigarettes. Medicines may: Help you to stop having the desire to smoke (cravings). Help to stop the problems that come when you stop smoking (withdrawal symptoms). Your doctor may ask you to use: Nicotine patches, gum, or lozenges. Nicotine inhalers or sprays. Non-nicotine medicine that  is taken by mouth. Find resources Find resources and other ways to help you quit smoking and remain smoke-free after you quit. These resources are most helpful when you use them often. They include: Online chats with a Social worker. Phone quitlines. Printed Furniture conservator/restorer. Support groups or group counseling. Text messaging programs. Mobile phone apps. Use apps on your mobile phone or tablet that can help you stick to your quit plan. There are many free apps for mobile phones and tablets as well as websites. Examples include Quit Guide from the [employer] and smokefree.gov  What things can I do to make it easier to quit?  Talk to your family and friends. Ask them to support and encourage you. Call a phone quitline (1-800-QUIT-NOW), reach out to support groups, or work  with a Social worker. Ask people who smoke to not smoke around you. Avoid places that make you want to smoke, such as: Bars. Parties. Smoke-break areas at work. Spend time with people who do not smoke. Lower the stress in your life. Stress can make you want to smoke. Try these things to help your stress: Getting regular exercise. Doing deep-breathing exercises. Doing yoga. Meditating. Doing a body scan. To do this, close your eyes, focus on one area of your body at a time from head to toe. Notice which parts of your body are tense. Try to relax the muscles in those areas. How will I feel when I quit smoking? Day 1 to 3 weeks Within the first 24 hours, you may start to have some problems that come from quitting tobacco. These problems are very bad 2-3 days after you quit, but they do not often last for more than 2-3 weeks. You may get these symptoms: Mood swings. Feeling restless, nervous, angry, or annoyed. Trouble concentrating. Dizziness. Strong desire for high-sugar foods and nicotine. Weight gain. Trouble pooping (constipation). Feeling like you may vomit (nausea). Coughing or a sore throat. Changes in how the medicines that you take for other issues work in your body. Depression. Trouble sleeping (insomnia). Week 3 and afterward After the first 2-3 weeks of quitting, you may start to notice more positive results, such as: Better sense of smell and taste. Less coughing and sore throat. Slower heart rate. Lower blood pressure. Clearer skin. Better breathing. Fewer sick days. Quitting smoking can be hard. Do not give up if you fail the first time. Some people need to try a few times before they succeed. Do your best to stick to your quit plan, and talk with yourdoctor if you have any questions or concerns. Summary Smoking tobacco is the leading cause of preventable death. Quitting smoking can be hard, but it is one of the best things that you can do for your health. When you  decide to quit smoking, make a plan to help you succeed. Quit smoking right away, not slowly over a period of time. When you start quitting, seek help from your doctor, family, or friends. This information is not intended to replace advice given to you by your health care provider. Make sure you discuss any questions you have with your healthcare provider. Document Revised: 05/10/2019 Document Reviewed: 11/03/2018 Elsevier Patient Education  [place].

## 2021-04-15 NOTE — Chronic Care Management (AMB) (Signed)
Care Management    RN Visit Note  04/15/2021 Name: Sean Blake MRN: [id number] DOB: [birth year]  Subjective: Sean Blake is a 50 y.o. year old male who is a primary care patient of [name], DO. The care management team was consulted for assistance with disease management and care coordination needs.    Engaged with patient by telephone for initial visit in response to provider referral for case management and/or care coordination services.   Consent to Services:   Mr. Hanners was given information about Care Management services today including:  Care Management services includes personalized support from designated clinical staff supervised by his physician, including individualized plan of care and coordination with other care providers 24/7 contact phone numbers for assistance for urgent and routine care needs. The patient may stop case management services at any time by phone call to the office staff.  Patient agreed to services and consent obtained.   Assessment: Review of patient past medical history, allergies, medications, health status, including review of consultants reports, laboratory and other test data, was performed as part of comprehensive evaluation and provision of chronic care management services.   SDOH (Social Determinants of Health) assessments and interventions performed:  SDOH Interventions    Flowsheet Row Most Recent Value  SDOH Interventions   Housing Interventions Other (Comment)  [sleeps in his truck]  Physical Activity Interventions Other (Comments)  [limited mobility- cannot tolerated standing more than 10-15 mins]  Social Connections Interventions Other (Comment)  [has children and friends, good support system]  Transportation Interventions Other (Comment)  [sometimes sleeps in his truck]        Care Plan  Allergies  Allergen Reactions   Gabapentin Palpitations    Outpatient Encounter Medications as of 04/15/2021   Medication Sig   meloxicam (MOBIC) 15 MG tablet Take 1 tablet (15 mg total) by mouth daily as needed for pain (arthritis joint pain).   No facility-administered encounter medications on file as of 04/15/2021.    There are no problems to display for this patient.   Conditions to be addressed/monitored: HTN, Anxiety, Depression, and grief, neck mass, falls and safety, osteoarthritis, smoker, and homelessness  Care Plan : RNCM: Adult General plan of care for neck mass, HTN, grief, anxiety, depression, osteoarthritis pain, frequent falls, homeless, and smoker  Updates made by Vanita Ingles since 04/15/2021 12:00 AM     Problem: RNCM: Adult General plan of care for neck mass, HTN, grief, anxiety, depression, osteoarthritis pain, frequent falls, homeless, and smoker   Priority: High     Long-Range Goal: RNCM: Adult General plan of care for neck mass, HTN, grief, anxiety, depression, osteoarthritis pain, frequent falls, homeless, and smoker   Start Date: 04/15/2021  Expected End Date: 04/15/2022  This Visit's Progress: Not on track  Priority: High  Note:   Current Barriers:  Knowledge Deficits related to plan of care for management of HTN, Homelessness, Anxiety with Excessive Worry, neck mass, osteoarthritis, high risk for falls, and smokes  Social Anxiety,, Depression: depressed mood, insomnia, fatigue, anxiety, loss of energy/fatigue, disturbed sleep, weight gain,, Grief, Insomnia/Sleep Difficulties, Stress at the loss of his son and other deaths within a 6 month period of time , and PTSD, and limited resources due to no income   Care Coordination needs related to Financial constraints related to homelessness and inability to afford the basic necessities , Limited social support, Limited access to food, Housing barriers, Level of care concerns, Medication procurement, ADL IADL limitations, Mental Health  Concerns , Substance abuse issues -  smoker, occasional pot smoker, occasional alcohol  use, Social Isolation, Lack of essential utilities - homeless*, and Lacks knowledge of community resource: needs help with finances, medications cost, finding a place to stay (sleeps in his truck a lot), medical care for neck mass likely malignant)   Chronic Disease Management support and education needs related to HTN, Homelessness, Anxiety with Excessive Worry, Social Anxiety,, Depression: depressed mood, insomnia, fatigue, anxiety, disturbed sleep, weight gain,, Grief, Insomnia/Sleep Difficulties, and Stress at loss of son and others within a 6 month period of time, and chronic pain, falls,  smoker, and neck mass Lacks caregiver support.  Film/video editor.  Difficulty obtaining medications Homeless and neck mass highly suspicious for malignancy   RNCM Clinical Goal(s):  Patient will verbalize understanding of plan for management of HTN, Anxiety, Depression, Homelessness, and grief, neck mass, osteoarthritis, and high risk for falls verbalize basic understanding of HTN, Anxiety, Depression, Homelessness, and neck mass, osteoarthritis, grief, and smoker disease process and self health management plan  take all medications exactly as prescribed and will call provider for medication related questions demonstrate understanding of rationale for each prescribed medication work with the pharm D on cost constraints  attend all scheduled medical appointments: saw pcp on 04-13-2021, will follow up again with pcp in about one month demonstrate improved and ongoing adherence to prescribed treatment plan for HTN, Anxiety, Depression, Homelessness, and neck mass, osteoarthritis, smoker, and fall risk as evidenced by adherence to prescribed medication regimen contacting provider for new or worsened symptoms or questions working with the CCM team to assist with resources to help with maintaining health and well being   demonstrate improved and ongoing health management independence getting the assistance  he needs to help with his multiple chronic conditions and establish essentials to improve health and well being  continue to work with RN Care Manager to address care management and care coordination needs related to HTN, Anxiety, Depression, Homelessness, and neck mass, osteoarthritis, grief, smoker, and frequent falls  work with pharmacist to address Financial constraints related to being able to afford basic necessities , Limited social support, Limited access to food, Housing barriers, Level of care concerns, Medication procurement, ADL IADL limitations, Mental Health Concerns , Substance abuse issues -  smoker, occasional smoker of pot, occasional alcohol use, and Social Isolation related to HTN, Anxiety, Depression, Homelessness, and neck mass, grief, smoker, osteoarthritis, and high falls risk work with Education officer, museum to address Financial constraints related to the need to obtain housing, and resources to pay for chronic disease management,medications, Limited social support, Limited access to food, Housing barriers, Level of care concerns, ADL IADL limitations, Mental Health Concerns , Substance abuse issues -  smoker, alcohol use and pot use , and Social Isolation related to the management of HTN, Anxiety, Depression, Homelessness, and grief, neck mass, osteoarthritis, falls, and smoker work with [employer] care guide to address needs related to Financial constraints related to financial needs for housing, food, and health care, Limited social support, Limited access to food, Housing barriers, and Level of care concerns  demonstrate a decrease in HTN, Anxiety, Depression, Homelessness, and grief, neck mass, osteoarthritis, falls and smoker  exacerbations  demonstrate ongoing self health care management ability by working with the CCM team to meet needs expressed through collaboration with Consulting civil engineer, provider, and care team.   Interventions: 1:1 collaboration with primary care provider  regarding development and update of comprehensive plan of care as evidenced by provider  attestation and co-signature Inter-disciplinary care team collaboration (see longitudinal plan of care) Evaluation of current treatment plan related to  self management and patient's adherence to plan as established by provider   SDOH Barriers (Status: New goal. Goal on track: NO.)  Patient interviewed and SDOH assessment performed        SDOH Interventions    Flowsheet Row Most Recent Value  SDOH Interventions   Housing Interventions Other (Comment)  [sleeps in his truck]  Physical Activity Interventions Other (Comments)  [limited mobility- cannot tolerated standing more than 10-15 mins]  Social Connections Interventions Other (Comment)  [has children and friends, good support system]  Transportation Interventions Other (Comment)  [sometimes sleeps in his truck]     Patient interviewed and appropriate assessments performed Referred patient to community resources care guide team for assistance with housing needs, food resources and community resources to assist with SDOH and basic needs  Provided mental health counseling with regard to grief, depression and anxiety (mental health diagnosis or concern) Provided patient with information about griefshare.org and sent information by email to the patient with CAFA. Instructed the patient to fill out and get needed documents for application to see if he could be approved for assistance in the [facility] system for multiple chronic conditions.  Discussed plans with patient for ongoing care management follow up and provided patient with direct contact information for care management team Advised patient to look for email with CAFA application, look for email with griefshare information, expect calls from the pharm D, the LCSW, the care guides, and ongoing support from the [facility], also advised the patient to call for questions, help, or concerns with filing out  paperwork for CAFA.  Collaborated with RN Case Manager re: SDOH needs, Chronic disease management and care coordination needs  Collaborated with care guides  (community agency) re: housing, food resources, cost constraints, no income coming in at this time.  Assisted patient/caregiver with obtaining information about health plan benefits Provided education to patient/caregiver regarding level of care options. Referred patient to LCSW for long term follow up and therapy/counseling in relation to anxiety, depression, grief, and substance use    Falls:  (Status: New goal. Goal on track: YES.) Provided written and verbal education re: potential causes of falls and Fall prevention strategies Reviewed medications and discussed potential side effects of medications such as dizziness and frequent urination Advised patient of importance of notifying provider of falls Assessed for signs and symptoms of orthostatic hypotension Assessed for falls since last encounter Assessed patients knowledge of fall risk prevention secondary to previously provided education  Grief, depression, and anxiety   (Status: New goal. Goal on track: NO.) Evaluation of current treatment plan related to Anxiety, Depression, Homelessness, and grief , Financial constraints related to inability to afford health care, basic needs and medicaitons , Limited social support, Limited access to food, Housing barriers, Level of care concerns, ADL IADL limitations, Mental Health Concerns , Substance abuse issues -  smoker, alcohol use and pot use, and Social Isolation self-management and patient's adherence to plan as established by provider. Discussed plans with patient for ongoing care management follow up and provided patient with direct contact information for care management team Advised patient to fill out CAFA application and follow up with the SW through the Medicaid office to help with obtaining updated information and apply for full  Medicaid. The patient only has family planning Medicaid; Provided education to patient re: griefshare.org information and local groups to help with his grief  over the loss of his son and other family and friends over a 6 month period of time. ; Collaborated with LCSW regarding CAFA application and the RNCM being able to reach out sooner to the patient to assist with the needs expressed with pcp on 04-13-2021; Provided patient and/or caregiver with housing, food resources, community resources information about care guides calling him for assistance with resources for [employer]); Provided patient with grief support and CAFA educational materials related to grief and loss and financial assistance through [facility]; [place] referral for housing, food resources, and other resources to help the patient in [facility]; Social Work referral for program assistance, mental health counseling and support ; Discussed plans with patient for ongoing care management follow up and provided patient with direct contact information for care management team; Screening for signs and symptoms of depression related to chronic disease state;  Assessed social determinant of health barriers;   Health Maintenance (Status: New goal.)  Patient interviewed about adult health maintenance status including Depression screen    Falls risk assessment    Blood Pressure      Advised patient to discuss Depression screen    Falls risk assessment    Blood Pressure    with primary care provider       Hypertension: (Status: New goal.) Last practice recorded BP readings:  BP Readings from Last 3 Encounters:  04/13/21 (!) 149/97  05/29/18 (!) 141/87  09/21/17 (!) 147/89  Most recent eGFR/CrCl: No results found for: EGFR  No components found for: CRCL  Evaluation of current treatment plan related to hypertension self management and patient's adherence to plan as established by provider;    Provided education to patient re: stroke prevention, s/s of heart attack and stroke; Reviewed prescribed diet heart healthy diet Reviewed medications with patient and discussed importance of compliance;  Counseled on adverse effects of illicit drug and excessive alcohol use in patients with high blood pressure;  Discussed plans with patient for ongoing care management follow up and provided patient with direct contact information for care management team; Provided education on prescribed diet heart healthy diet, will send information by EMMI and my chart;  Discussed complications of poorly controlled blood pressure such as heart disease, stroke, circulatory complications, vision complications, kidney impairment, sexual dysfunction;   Pain:  (Status: New goal. Goal on track: YES.) Pain assessment performed- patient with osteoarthritis limiting him from doing his usual work at [employer] Medications reviewed- only takes Meloxicam  Reviewed provider established plan for pain management; Discussed importance of adherence to all scheduled medical appointments; Counseled on the importance of reporting any/all new or changed pain symptoms or management strategies to pain management provider; Advised patient to report to care team affect of pain on daily activities; Discussed use of relaxation techniques and/or diversional activities to assist with pain reduction (distraction, imagery, relaxation, massage, acupressure, TENS, heat, and cold application; Reviewed with patient prescribed pharmacological and nonpharmacological pain relief strategies; Pharm D consult to see if the patient can obtain Meloxicam through the [facility]    Neck mass  (Status: New goal. Goal on track: NO.) Evaluation of current treatment plan related to  neck mass , Financial constraints related to no health insurance  and Level of care concerns self-management and patient's adherence to plan as established  by provider. Discussed plans with patient for ongoing care management follow up and provided patient with direct contact information for care management team Advised patient to file out application  for CAFA, the patient has no insurance and the neck mass is highly suspicious for malignancy and needs to be treated. The CCM team is working with the patient to get health care and treatment for chronic conditions; Provided education to patient re: CCM team and helping paitent achieve his health and wellness goals ; Collaborated with CCM team and pcp  regarding financial constraints and resources provided for the patient to obtain needed care for neck mass and other health related needs. ; Discussed plans with patient for ongoing care management follow up and provided patient with direct contact information for care management team; new  Smoking Cessation: (Status: New goal. Goal on track: NO.) Reviewed smoking history:  tobacco abuse of 40 plus years; currently smoking 0.5 ppd Previous quit attempts, unsuccessful not any successful using has never tried   Reports smoking within 30 minutes of waking up Reports triggers to smoke include: anxiety, grief, depression, life stressors Reports motivation to quit smoking includes: not interested at this time On a scale of 1-10, reports MOTIVATION to quit is not interested at this time On a scale of 1-10, reports CONFIDENCE in quitting is Not interested at this time. Reviewed with the patient there are ways to help with smoking cessation when he is ready  Evaluation of current treatment plan reviewed; Advised patient to discuss smoking cessation options with provider; Provided contact information for [place] [facility] (1-800-QUIT-NOW); Provided patient with printed smoking cessation educational materials; Discussed plans with patient for ongoing care management follow up and provided patient with direct contact information for care management team;  Patient  Goals/Self-Care Activities: Patient will self administer medications as prescribed Patient will attend all scheduled provider appointments Patient will call pharmacy for medication refills Patient will attend church or other social activities Patient will continue to perform ADL's independently Patient will continue to perform IADL's independently Patient will call provider office for new concerns or questions Patient will work with BSW to address care coordination needs and will continue to work with the clinical team to address health care and disease management related needs.   Patient will obtain needed resources for SDOH to assist with maintaining health and well being       Plan: Telephone follow up appointment with care management team member scheduled for:  04-22-2021 at 1 pm  [place], MSN, [place]: [phone]

## 2021-04-19 ENCOUNTER — Telehealth: Payer: Self-pay | Admitting: *Deleted

## 2021-04-19 ENCOUNTER — Ambulatory Visit: Payer: Medicaid Other | Admitting: Pharmacist

## 2021-04-19 DIAGNOSIS — M159 Polyosteoarthritis, unspecified: Secondary | ICD-10-CM

## 2021-04-19 DIAGNOSIS — M8949 Other hypertrophic osteoarthropathy, multiple sites: Secondary | ICD-10-CM

## 2021-04-19 NOTE — Telephone Encounter (Signed)
   Telephone encounter was:  Successful.  04/19/2021 Name: Sean Blake MRN: [id number] DOB: [birth year]  [name] is a 50 y.o. year old male who is a primary care patient of [name], DO . The community resource team was consulted for assistance with talked with patient and he is familiar with all of the clinics in the county and says he is calling to find out availability , provided information on how to file medicaid and patient did not need further information emaling food banks   Care guide performed the following interventions: Patient provided with information about care guide support team and interviewed to confirm resource needs.  Follow Up Plan:  No further follow up planned at this time. The patient has been provided with needed resources.  [place], Care Management  [phone] [address] , [place] [address] Email : [email or web address]

## 2021-04-19 NOTE — Patient Instructions (Signed)
Thank you allowing the Care Management Team to be a part of your care! It was a pleasure speaking with you today!     Care Management Team    Noreene Larsson RN, MSN, CCM Nurse Care Coordinator  [phone]   Harlow Asa PharmD  Clinical Pharmacist  [phone]   [name], MSW, LCSW Clinical Social Worker [phone]  Visit Information   Goals Addressed             This Visit's Progress    Pharmacy Goals       It was a pleasure speaking with you today!  Please follow up with [facility]  Phone: [phone] Address: [address] [place], [place] [address] Mailing Address: [address], [place], [place] [address]  Feel free to call me with any questions or concerns.   Wallace Cullens, PharmD, [place], CPP Clinical Pharmacist [facility] [phone]        Mr. Rubley was given information about Care Management services today including:  Care Management services include personalized support from designated clinical staff supervised by his physician, including individualized plan of care and coordination with other care providers 24/7 contact phone numbers for assistance for urgent and routine care needs. The patient may stop CCM services at any time (effective at the end of the month) by phone call to the office staff.  Patient agreed to services and verbal consent obtained.   Patient verbalizes understanding of instructions provided today and agrees to view in [place].   Telephone follow up appointment with care management team member scheduled for: 05/17/2021 at 10:00 AM

## 2021-04-19 NOTE — Chronic Care Management (AMB) (Signed)
Care Management   Pharmacy Note  04/19/2021 Name: Sean Blake MRN: [id number] DOB: 06-05-1971  Subjective: Sean Blake is a 50 y.o. year old male who is a primary care patient of Sean Hauser, DO. The Care Management team was consulted for assistance with care management and care coordination needs.    Engaged with patient by telephone for initial visit in response to provider referral for pharmacy case management and/or care coordination services.   The patient was given information about Care Management services today including:  Care Management services includes personalized support from designated clinical staff supervised by the patient's primary care provider, including individualized plan of care and coordination with other care providers. 24/7 contact phone numbers for assistance for urgent and routine care needs. The patient may stop case management services at any time by phone call to the office staff.  Patient agreed to services and consent obtained.  Assessment:  Review of patient status, including review of consultants reports, laboratory and other test data, was performed as part of comprehensive evaluation and provision of chronic care management services.   SDOH (Social Determinants of Health) assessments and interventions performed:  none  Objective:  Lab Results  Component Value Date   CREATININE 0.83 05/29/2018    BP Readings from Last 3 Encounters:  04/13/21 (!) 149/97  05/29/18 (!) 141/87  09/21/17 (!) 147/89    Care Plan  Allergies  Allergen Reactions   Gabapentin Palpitations    Medications Reviewed Today     Reviewed by Sean Blake, RPH-CPP (Pharmacist) on 04/19/21 at 41  Med List Status: <None>   Medication Order Taking? Sig Documenting Provider Last Dose Status Informant  ibuprofen (ADVIL) 200 MG tablet [id number] Yes Take 200 mg by mouth every 6 (six) hours as needed. [provider] Taking Active             Med Note Curley Spice, [facility] A   Mon Apr 19, 2021 10:31 AM) Reports taking 2 tablets (400 mg) twice daily as needed  meloxicam (MOBIC) 15 MG tablet [id number] No Take 1 tablet (15 mg total) by mouth daily as needed for pain (arthritis joint pain).  Patient not taking: Reported on 04/19/2021   Sean Hauser, DO Not Taking Active             There are no problems to display for this patient.   Conditions to be addressed/monitored: osteoarthritis  Care Plan : PharmD - Medication Access  Updates made by Sean Blake, RPH-CPP since 04/19/2021 12:00 AM     Problem: Disease Progression      Long-Range Goal: Disease Progression Prevented or Minimized   Start Date: 04/19/2021  Expected End Date: 07/18/2021  This Visit's Progress: On track  Priority: High  Note:   Current Barriers:  Unable to independently afford treatment regimen  Pharmacist Clinical Goal(s):  Over the next 90 days, patient will verbalize ability to afford treatment regimen through collaboration with PharmD and provider.    Interventions: 1:1 collaboration with Sean Hauser, DO regarding development and update of comprehensive plan of care as evidenced by provider attestation and co-signature Inter-disciplinary care team collaboration (see longitudinal plan of care) Perform chart review.  Patient seen for Office Visit with PCP on 8/16 to establish care and for neck mass. Provider advised patient: START OTC Voltaren (generic Diclofenac) topical 2-4 times a day as needed for pain swelling of affected joint for 1-2 weeks or longer Start taking Tylenol Extra Strength '500mg'$  tabs -  take 1 to 2 tabs per dose (max '1000mg'$ ) every 6-8 hours for pain (take regularly, don't skip a dose for next 7 days), max 24 hour daily dose is 6 tablets or '3000mg'$ . In the future you can repeat the same everyday Tylenol course for 1-2 weeks at a time. Contact [facility]  Patient seen in [facility] ED on 5/28 for a mass at the sternoclavicular joint accompanied by progressive dysphagia Comprehensive medication review performed; medication list updated in electronic medical record Patient reports he has been unable to afford to pick up meloxicam Rx due to lack of insurance coverage (reports having [place] Medicaid - Family Planning) Reports instead currently taking OTC ibuprofen 200 mg - 2 tablets (400 mg) twice daily as needed Patient verbalizes understanding that if he starts taking meloxicam, to stop ibuprofen and any other NSAID medicaitons Denies currently taking Tylenol or using diclofenac gel as recommended by PCP. Encourage patient to review directions and try Tylenol (acetaminophen) as recommended by PCP on after visit summary  Coordination of Care: Today patient reports he did contact [facility], but facility not accepting new patients until September Encourage patient to call [facility] today. Provide patient with phone number Patient confirms received [place] application as sent to him by CM Nurse Case Manager. Encourage patient to contact [place] today regarding his question about required documentation of proof of residence Encourage patient to complete and return paperwork to [facility]  Medication Assistance: Patient reports meloxicam is unaffordable due to his lack of prescription coverage Based on patient's age, lack of prescription coverage and reported income, patient would meet requirements to apply for [facility]). Provide patient with [facility] phone number [phone]) and encourage him to call today about completing clinic application process  Patient Goals/Self-Care Activities Over the next 90 days, patient will:  - collaborate with provider on medication access solutions  Follow Up Plan: Telephone follow up appointment with care management team  member scheduled for: 05/17/2021 at 10:00 AM      Follow Up:  Patient agrees to Care Plan and Follow-up.  Sean Blake, PharmD, Para [place], CPP Clinical Pharmacist [facility] [phone]

## 2021-04-21 ENCOUNTER — Telehealth: Payer: Self-pay | Admitting: Family Medicine

## 2021-04-21 NOTE — Telephone Encounter (Signed)
   Telephone encounter was:  Unsuccessful.  04/21/2021 Name: CORRIS COCHRANE MRN: [id number] DOB: August 23, 1971  Unsuccessful outbound call made today to assist with:  Food Insecurity and housing.  Outreach Attempt:  1st Attempt  A HIPAA compliant voice message was left requesting a return call.  Instructed patient to call back at [phone].  April Green Care Guide, Embedded Care Coordination [place], Care Management Phone: [phone] Email: [email or web address]

## 2021-04-22 ENCOUNTER — Telehealth: Payer: Self-pay | Admitting: General Practice

## 2021-04-22 ENCOUNTER — Telehealth: Payer: Medicaid Other

## 2021-04-22 ENCOUNTER — Ambulatory Visit: Payer: Self-pay | Admitting: General Practice

## 2021-04-22 DIAGNOSIS — R296 Repeated falls: Secondary | ICD-10-CM

## 2021-04-22 DIAGNOSIS — M8949 Other hypertrophic osteoarthropathy, multiple sites: Secondary | ICD-10-CM

## 2021-04-22 DIAGNOSIS — F419 Anxiety disorder, unspecified: Secondary | ICD-10-CM

## 2021-04-22 DIAGNOSIS — R221 Localized swelling, mass and lump, neck: Secondary | ICD-10-CM

## 2021-04-22 DIAGNOSIS — M150 Primary generalized (osteo)arthritis: Secondary | ICD-10-CM

## 2021-04-22 DIAGNOSIS — F4321 Adjustment disorder with depressed mood: Secondary | ICD-10-CM

## 2021-04-22 DIAGNOSIS — I10 Essential (primary) hypertension: Secondary | ICD-10-CM

## 2021-04-22 DIAGNOSIS — F32A Depression, unspecified: Secondary | ICD-10-CM

## 2021-04-22 DIAGNOSIS — F17200 Nicotine dependence, unspecified, uncomplicated: Secondary | ICD-10-CM

## 2021-04-22 DIAGNOSIS — M159 Polyosteoarthritis, unspecified: Secondary | ICD-10-CM

## 2021-04-22 NOTE — Chronic Care Management (AMB) (Signed)
Care Management    RN Visit Note  04/22/2021 Name: Sean Blake MRN: [id number] DOB: Aug 12, 1971  Subjective: Sean Blake is a 50 y.o. year old male who is a primary care patient of [name], DO. The care management team was consulted for assistance with disease management and care coordination needs.    Engaged with patient by telephone for follow up visit in response to provider referral for case management and/or care coordination services.   Consent to Services:   Sean Blake was given information about Care Management services today including:  Care Management services includes personalized support from designated clinical staff supervised by his physician, including individualized plan of care and coordination with other care providers 24/7 contact phone numbers for assistance for urgent and routine care needs. The patient may stop case management services at any time by phone call to the office staff.  Patient agreed to services and consent obtained.   Assessment: Review of patient past medical history, allergies, medications, health status, including review of consultants reports, laboratory and other test data, was performed as part of comprehensive evaluation and provision of chronic care management services.   SDOH (Social Determinants of Health) assessments and interventions performed:    Care Plan  Allergies  Allergen Reactions   Gabapentin Palpitations    Outpatient Encounter Medications as of 04/22/2021  Medication Sig Note   ibuprofen (ADVIL) 200 MG tablet Take 200 mg by mouth every 6 (six) hours as needed. 04/19/2021: Reports taking 2 tablets (400 mg) twice daily as needed   meloxicam (MOBIC) 15 MG tablet Take 1 tablet (15 mg total) by mouth daily as needed for pain (arthritis joint pain). (Patient not taking: Reported on 04/19/2021)    No facility-administered encounter medications on file as of 04/22/2021.    There are no problems to display  for this patient.   Conditions to be addressed/monitored: HTN, Anxiety, Depression, and neck mass, grief, smoker, frequent falls, homelessness, and smoker  Care Plan : RNCM: Adult General plan of care for neck mass, HTN, grief, anxiety, depression, osteoarthritis pain, frequent falls, homeless, and smoker  Updates made by Sean Blake since 04/22/2021 12:00 AM     Problem: RNCM: Adult General plan of care for neck mass, HTN, grief, anxiety, depression, osteoarthritis pain, frequent falls, homeless, and smoker   Priority: High     Long-Range Goal: RNCM: Adult General plan of care for neck mass, HTN, grief, anxiety, depression, osteoarthritis pain, frequent falls, homeless, and smoker   Start Date: 04/15/2021  Expected End Date: 04/15/2022  This Visit's Progress: On track  Recent Progress: Not on track  Priority: High  Note:   Current Barriers:  Knowledge Deficits related to plan of care for management of HTN, Homelessness, Anxiety with Excessive Worry, neck mass, osteoarthritis, high risk for falls, and smokes  Social Anxiety,, Depression: depressed mood, insomnia, fatigue, anxiety, loss of energy/fatigue, disturbed sleep, weight gain,, Grief, Insomnia/Sleep Difficulties, Stress at the loss of his son and other deaths within a 6 month period of time , and PTSD, and limited resources due to no income   Care Coordination needs related to Financial constraints related to homelessness and inability to afford the basic necessities , Limited social support, Limited access to food, Housing barriers, Level of care concerns, Medication procurement, ADL IADL limitations, Mental Health Concerns , Substance abuse issues -  smoker, occasional pot smoker, occasional alcohol use, Social Isolation, Lack of essential utilities - homeless*, and Lacks knowledge of community resource:  needs help with finances, medications cost, finding a place to stay (sleeps in his truck a lot), medical care for neck mass  likely malignant)   Chronic Disease Management support and education needs related to HTN, Homelessness, Anxiety with Excessive Worry, Social Anxiety,, Depression: depressed mood, insomnia, fatigue, anxiety, disturbed sleep, weight gain,, Grief, Insomnia/Sleep Difficulties, and Stress at loss of son and others within a 6 month period of time, and chronic pain, falls,  smoker, and neck mass Lacks caregiver support.  Film/video editor.  Difficulty obtaining medications Homeless and neck mass highly suspicious for malignancy   RNCM Clinical Goal(s):  Patient will verbalize understanding of plan for management of HTN, Anxiety, Depression, Homelessness, and grief, neck mass, osteoarthritis, and high risk for falls verbalize basic understanding of HTN, Anxiety, Depression, Homelessness, and neck mass, osteoarthritis, grief, and smoker disease process and self health management plan  take all medications exactly as prescribed and will call provider for medication related questions demonstrate understanding of rationale for each prescribed medication work with the pharm D on cost constraints  attend all scheduled medical appointments: saw pcp on 04-13-2021, will follow up again with pcp in about one month demonstrate improved and ongoing adherence to prescribed treatment plan for HTN, Anxiety, Depression, Homelessness, and neck mass, osteoarthritis, smoker, and fall risk as evidenced by adherence to prescribed medication regimen contacting provider for new or worsened symptoms or questions working with the CCM team to assist with resources to help with maintaining health and well being   demonstrate improved and ongoing health management independence getting the assistance he needs to help with his multiple chronic conditions and establish essentials to improve health and well being  continue to work with RN Care Manager to address care management and care coordination needs related to HTN, Anxiety,  Depression, Homelessness, and neck mass, osteoarthritis, grief, smoker, and frequent falls  work with pharmacist to address Financial constraints related to being able to afford basic necessities , Limited social support, Limited access to food, Housing barriers, Level of care concerns, Medication procurement, ADL IADL limitations, Mental Health Concerns , Substance abuse issues -  smoker, occasional smoker of pot, occasional alcohol use, and Social Isolation related to HTN, Anxiety, Depression, Homelessness, and neck mass, grief, smoker, osteoarthritis, and high falls risk work with Education officer, museum to address Financial constraints related to the need to obtain housing, and resources to pay for chronic disease management,medications, Limited social support, Limited access to food, Housing barriers, Level of care concerns, ADL IADL limitations, Mental Health Concerns , Substance abuse issues -  smoker, alcohol use and pot use , and Social Isolation related to the management of HTN, Anxiety, Depression, Homelessness, and grief, neck mass, osteoarthritis, falls, and smoker work with [employer] care guide to address needs related to Financial constraints related to financial needs for housing, food, and health care, Limited social support, Limited access to food, Housing barriers, and Level of care concerns  demonstrate a decrease in HTN, Anxiety, Depression, Homelessness, and grief, neck mass, osteoarthritis, falls and smoker  exacerbations  demonstrate ongoing self health care management ability by working with the CCM team to meet needs expressed through collaboration with Consulting civil engineer, provider, and care team.   Interventions: 1:1 collaboration with primary care provider regarding development and update of comprehensive plan of care as evidenced by provider attestation and co-signature Inter-disciplinary care team collaboration (see longitudinal plan of care) Evaluation of current treatment plan  related to chronic disease management and  self management and patient's  adherence to plan as established by provider   SDOH Barriers (Status: Goal on track: YES.)  Patient interviewed and SDOH assessment performed        SDOH Interventions    Flowsheet Row Most Recent Value  SDOH Interventions   Housing Interventions Other (Comment)  [sleeps in his truck]  Physical Activity Interventions Other (Comments)  [limited mobility- cannot tolerated standing more than 10-15 mins]  Social Connections Interventions Other (Comment)  [has children and friends, good support system]  Transportation Interventions Other (Comment)  [sometimes sleeps in his truck]     Patient interviewed and appropriate assessments performed Referred patient to community resources care guide team for assistance with housing needs, food resources and community resources to assist with SDOH and basic needs. 04-22-2021: The patient is actively working with the CCM team and care guides to get what he needs to obtain medication, food resources, housing, and health care. [facility]. Turned in application yesterday for the housing authority.  Provided mental health counseling with regard to grief, depression and anxiety (mental health diagnosis or concern) Provided patient with information about griefshare.org and sent information by email to the patient with CAFA. Instructed the patient to fill out and get needed documents for application to see if he could be approved for assistance in the [employer] system for multiple chronic conditions. 04-22-2021: Has filled out CAFA form and sent form back in to the email listed on the form.  Discussed plans with patient for ongoing care management follow up and provided patient with direct contact information for care management team Advised patient to look for email with CAFA application, look for email with griefshare  information, expect calls from the pharm D, the LCSW, the care guides, and ongoing support from the [facility], also advised the patient to call for questions, help, or concerns with filing out paperwork for CAFA.  Collaborated with RN Case Manager re: SDOH needs, Chronic disease management and care coordination needs  Collaborated with care guides  (community agency) re: housing, food resources, cost constraints, no income coming in at this time.  Assisted patient/caregiver with obtaining information about health plan benefits Provided education to patient/caregiver regarding level of care options. Referred patient to LCSW for long term follow up and therapy/counseling in relation to anxiety, depression, grief, and substance use    Falls:  (Status: Goal on track: YES.) Provided written and verbal education re: potential causes of falls and Fall prevention strategies Reviewed medications and discussed potential side effects of medications such as dizziness and frequent urination Advised patient of importance of notifying provider of falls Assessed for signs and symptoms of orthostatic hypotension Assessed for falls since last encounter. 04-22-2021: Denies any new falls at this time. Will continue to monitor for changes. Assessed patients knowledge of fall risk prevention secondary to previously provided education  Grief, depression, and anxiety   (Status: Goal on track: YES.) Evaluation of current treatment plan related to Anxiety, Depression, Homelessness, and grief , Financial constraints related to inability to afford health care, basic needs and medicaitons , Limited social support, Limited access to food, Housing barriers, Level of care concerns, ADL IADL limitations, Mental Health Concerns , Substance abuse issues -  smoker, alcohol use and pot use, and Social Isolation self-management and patient's adherence to plan as established by provider. 04-22-2021: Has filled out several applications for  assistance with medications, housing, healthcare affordability and food resources. [facility] the  [facility] at the time of the call. Will continue to monitor for changes.  Discussed plans with patient for ongoing care management follow up and provided patient with direct contact information for care management team Advised patient to fill out CAFA application and follow up with the SW through the Medicaid office to help with obtaining updated information and apply for full Medicaid. The patient only has family planning Medicaid; Provided education to patient re: griefshare.org information and local groups to help with his grief over the loss of his son and other family and friends over a 6 month period of time. ; Collaborated with LCSW regarding CAFA application and the RNCM being able to reach out sooner to the patient to assist with the needs expressed with pcp on 04-13-2021, follow up with the LCSW on 04-27-2021 Provided patient and/or caregiver with housing, food resources, community resources information about care guides calling him for assistance with resources for [employer] 04-22-2021: Is currently working with the care guides for housing information, food resources, and other community resources; Provided patient with grief support and CAFA educational materials related to grief and loss and financial assistance through [employer]; Care Guide referral for housing, food resources, and other resources to help the patient in [place]. 04-22-2021: Is currently working with the care guides for needed resources.  Social Work referral for program assistance, mental health counseling and support. Appointment with the LCSW on 04-27-2021; Discussed plans with patient for ongoing care management follow up and provided patient with direct contact information for care management team; Screening for signs and symptoms of depression related to  chronic disease state;  Assessed social determinant of health barriers;   Health Maintenance (Status: Goal on track: YES.)  Patient interviewed about adult health maintenance status including Depression screen    Falls risk assessment    Blood Pressure      Advised patient to discuss Depression screen    Falls risk assessment    Blood Pressure    with primary care provider       Hypertension: (Status: Goal on track: YES.) Last practice recorded BP readings:  BP Readings from Last 3 Encounters:  04/13/21 (!) 149/97  05/29/18 (!) 141/87  09/21/17 (!) 147/89  Most recent eGFR/CrCl: No results found for: EGFR  No components found for: CRCL  Evaluation of current treatment plan related to hypertension self management and patient's adherence to plan as established by provider.  04-22-2021: The patient denies any issues with HTN at this time. The patient is working on establishing permanent housing and getting help with needed health care cost. ;   Provided education to patient re: stroke prevention, s/s of heart attack and stroke; Reviewed prescribed diet heart healthy diet Reviewed medications with patient and discussed importance of compliance;  Counseled on adverse effects of illicit drug and excessive alcohol use in patients with high blood pressure;  Discussed plans with patient for ongoing care management follow up and provided patient with direct contact information for care management team; Provided education on prescribed diet heart healthy diet, will send information by EMMI and my chart;  Discussed complications of poorly controlled blood pressure such as heart disease, stroke, circulatory complications, vision complications, kidney impairment, sexual dysfunction;   Pain:  (Status: Goal on track: YES.) Pain assessment performed- patient with osteoarthritis limiting him from doing his usual work at [employer] Medications reviewed- only takes Meloxicam 04-22-2021: Working with  the [facility] to get  Meloxicam  Reviewed provider established plan for pain management; Discussed importance of adherence to all scheduled medical appointments; Counseled on the importance of reporting any/all new or changed pain symptoms or management strategies to pain management provider; Advised patient to report to care team affect of pain on daily activities; Discussed use of relaxation techniques and/or diversional activities to assist with pain reduction (distraction, imagery, relaxation, massage, acupressure, TENS, heat, and cold application; Reviewed with patient prescribed pharmacological and nonpharmacological pain relief strategies; Pharm D consult to see if the patient can obtain Mel[facility]am [facility]    Neck mass  (Status: Goal on track: NO.) Evaluation of current treatment plan related to  neck mass , Financial constraints related to no health insurance  and Level of care concerns self-management and patient's adherence to plan as established by provider. Discussed plans with patient for ongoing care management follow up and provided patient with direct contact information for care management team Advised patient to file out application for CAFA, the patient has no insurance and the neck mass is highly suspicious for malignancy and needs to be treated. The CCM team is working with the patient to get health care and treatment for chronic conditions. 04-22-2021: The patient is waiting to hear back from completed applications. [facility].  Provided education to patient re: CCM team and helping paitent achieve his health and wellness goals ; Collaborated with CCM team and pcp  regarding financial constraints and resources provided for the patient to obtain needed care for neck mass and other health related needs. ; Discussed plans with patient for ongoing care management follow up and provided patient with direct contact  information for care management team;  Smoking Cessation: (Status: New goal. Goal on track: NO.) Reviewed smoking history:  tobacco abuse of 40 plus years; currently smoking 0.5 ppd Previous quit attempts, unsuccessful not any successful using has never tried   Reports smoking within 30 minutes of waking up Reports triggers to smoke include: anxiety, grief, depression, life stressors Reports motivation to quit smoking includes: not interested at this time On a scale of 1-10, reports MOTIVATION to quit is not interested at this time On a scale of 1-10, reports CONFIDENCE in quitting is Not interested at this time. Reviewed with the patient there are ways to help with smoking cessation when he is ready  Evaluation of current treatment plan reviewed; Advised patient to discuss smoking cessation options with provider; Provided contact information for [place] [facility] (1-800-QUIT-NOW); Provided patient with printed smoking cessation educational materials; Discussed plans with patient for ongoing care management follow up and provided patient with direct contact information for care management team;  Patient Goals/Self-Care Activities: Patient will self administer medications as prescribed Patient will attend all scheduled provider appointments Patient will call pharmacy for medication refills Patient will attend church or other social activities Patient will continue to perform ADL's independently Patient will continue to perform IADL's independently Patient will call provider office for new concerns or questions Patient will work with BSW to address care coordination needs and will continue to work with the clinical team to address health care and disease management related needs.   Patient will obtain needed resources for SDOH to assist with maintaining health and well being       Plan: Telephone follow up appointment with care management team member scheduled for:  05-27-2021 at 230  pm  [place], MSN, [place]  [facility] Mobile: [phone]

## 2021-04-22 NOTE — Patient Instructions (Signed)
Visit Information   Goals Addressed             This Visit's Progress    RNCM: Find Help in My Community       Timeframe:  Short-Term Goal Priority:  High Start Date:       04-15-2021                      Expected End Date:     08-17-2021                  Follow Up Date 05/27/2021    - begin a notebook of services in my neighborhood or community - call 211 when I need some help - follow-up on any referrals for help I am given - think ahead to make sure my need does not become an emergency - make a note about what I need to have by the phone or take with me, like an identification card or social security number have a back-up plan - have a back-up plan - make a list of family or friends that I can call    Why is this important?   Knowing how and where to find help for yourself or family in your neighborhood and community is an important skill.  You will want to take some steps to learn how.    Notes: [phone]: CAFA application sent to the patient today by email at [email or web address], careguide referral made for SDOH, will ask the pharm D to assist with medications assistance. LCSW to follow up with the patient on expressed needs. Will continue to work with the patient to get needed resources to help with chronic conditions. 04-22-2021: The patient has returned the CAFA application back in. [facility] Has resources in place from the care guide team. Is working on getting housing form himself. He is thankful for working with the CCM team to get the help he needs. Will continue to monitor.      RNCM: Lifestyle Change-Hypertension       Timeframe:  Long-Range Goal Priority:  High Start Date:   04-15-2021                          Expected End Date:    04-15-2022                   Follow Up Date 05/27/2022    - agree on reward when goals are met - agree to work together to make changes - ask questions to understand - have a family meeting to talk about  healthy habits - learn about high blood pressure    Why is this important?   The changes that you are asked to make may be hard to do.  This is especially true when the changes are life-long.  Knowing why it is important to you is the first step.  Working on the change with your family or support person helps you not feel alone.  Reward yourself and family or support person when goals are met. This can be an activity you choose like bowling, hiking, biking, swimming or shooting hoops.     Notes: 04-15-2021: The patient is currently not on any blood pressure medications. Established care this week with pcp. Expected follow up x one month     RNCM: Make and Keep All Appointments       Timeframe:  Long-Range Goal  Priority:  High Start Date:       04-15-2021                      Expected End Date:      04-15-2022                 Follow Up Date 05/27/2021    - call to cancel if needed - keep a calendar with prescription refill dates - keep a calendar with appointment dates    Why is this important?   Part of staying healthy is seeing the doctor for follow-up care.  If you forget your appointments, there are some things you can do to stay on track.    Notes: 04-15-2021: The patient has a truck and can get to his appointments. 04-22-2021: Also has friends that help with getting him to places  he needs to go. [facility].      RNCM: Manage My Emotions       Timeframe:  Long-Range Goal Priority:  High Start Date:     04-15-2021                        Expected End Date:   04-15-2022                    Follow Up Date 05/27/2021    - call and visit an old friend - join a support group - laugh; watch a funny movie or comedian - learn and use visualization or guided imagery - perform a random act of kindness - practice relaxation or meditation daily - talk about feelings with a friend, family or spiritual advisor - practice  positive thinking and self-talk    Why is this important?   When you are stressed, down or upset, your body reacts too.  For example, your blood pressure may get higher; you may have a headache or stomachache.  When your emotions get the best of you, your body's ability to fight off cold and flu gets weak.  These steps will help you manage your emotions.     Notes: 04-15-2021: The patient has a lot of stressors in his life right now. Has had a lot of grief and loss over the last couple of years. His son died in 10/09/2018 and in a 6 month period of time he lost a best friend, the best friends son, his father.  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 Has moved back to [place]. Has a lot of health factors impacting his care. Is willing to work with the CCM team for support. Resources given and talked with the patient about griefshare.org for grief support and education. 04-22-2021: The patient has accomplished a lot of things since last week. His [facility] was there [facility]. He has emailed his application back to CAFA and is working with the [facility]. The patient expressed thanks for the support of the CCM team.      RNCM: Manage My Medicine       Timeframe:  Long-Range Goal Priority:  High Start Date:       04-15-2021                      Expected End  Date:        04-15-2022               Follow Up Date 05/27/2021    - call for medicine refill 2 or 3 days before it runs out - call if I am sick and can't take my medicine - keep a list of all the medicines I take; vitamins and herbals too - learn to read medicine labels - use a pillbox to sort medicine - use an alarm clock or phone to remind me to take my medicine    Why is this important?   These steps will help you keep on track with your medicines.   Notes: 04-15-2021: The patient states he has a hard time paying for his medications. The  patient has a script for Meloxicam. Will do a referral to pharm D for assistance with medication management clinic. Will continue to monitor. 04-22-2021: The patient is working with the [facility] to get his Meloxicam. The patient is appreciative of all the things the CCM team and pcp are doing to help him.      RNCM: Pain control       Timeframe:  Long-Range Goal Priority:  High Start Date:  04-15-2021                           Expected End Date:    04-15-2022                   Follow Up Date 05-27-2021    - call for medicine refill 2 or 3 days before it runs out - develop a personal pain management plan - keep track of prescription refills - plan exercise or activity when pain is best controlled - prioritize tasks for the day - stay active    Why is this important?   Day-to-day life can be hard when you have back pain.  Pain medicine is just one piece of the treatment puzzle. There are many things you can do to manage pain and keep your back strong.   Lifestyle changes, like stopping smoking and eating foods with Vitamin D and calcium, keep your bones and muscles healthy. Your back is better when it is supported by strong muscles.  You can try these action steps to help you manage your pain.     Notes: 04-15-2021: Has chronic osteoarthritis pain. Takes Meloxicam. 04-22-2021: Is working with the [facility] to get his Meloxicam. Is taking his medications as prescribed.     RNCM: Prevent Falls and Injury       Follow Up Date 05/27/2021    - add more outdoor lighting - always use handrails on the stairs - always wear low-heeled or flat shoes or slippers with nonskid soles - call the doctor if I am feeling too drowsy - install bathroom grab bars - join an exercise group in my community - keep a flashlight by the bed - keep my cell phone with me always - learn how to get back up if I fall - make an emergency alert plan in case I fall - pick up clutter from the floors - use a nonslip pad with throw  rugs, or remove them completely - use a nightlight in the bathroom    Why is this important?   Most falls happen when it is hard for you to walk safely. Your balance may be off because of an illness. You may have pain in your knees,  hip or other joints.  You may be overly tired or taking medicines that make you sleepy. You may not be able to see or hear clearly.  Falls can lead to broken bones, bruises or other injuries.  There are things you can do to help prevent falling.     Notes: 04-15-2021: The patient has frequent falls, mainly due to pain and discomfort from osteoarthritis. 04-22-2021: Denies any new falls since last outreach last week. Will continue to monitor.         Patient verbalizes understanding of instructions provided today and agrees to view in [place].   Telephone follow up appointment with care management team member scheduled for: 05-27-2021 at 230 pm  Noreene Larsson RN, MSN, [place] Mobile: [phone]

## 2021-04-22 NOTE — Telephone Encounter (Signed)
  Care Management   Follow Up Note   04/22/2021 Name: BOURNE DRABIK MRN: [id number] DOB: Aug 04, 1971   Referred by: [name], DO Reason for referral : Care Coordination (RNCM: Follow up for Chronic Disease Management and Care Coordination Needs)   An unsuccessful telephone outreach was attempted today. The patient was referred to the case management team for assistance with care management and care coordination.   Follow Up Plan: A HIPPA compliant phone message was left for the patient providing contact information and requesting a return call.   Noreene Larsson RN, MSN, [place] Mobile: [phone]

## 2021-04-27 ENCOUNTER — Ambulatory Visit: Payer: Medicaid Other | Admitting: Licensed Clinical Social Worker

## 2021-05-04 NOTE — Patient Instructions (Signed)
Visit Information   Goals Addressed             This Visit's Progress    Find Help in My Community   On track    Timeframe:  Long-Range Goal Priority:  High Start Date:          04/27/21                   Expected End Date:      06/28/21                 Follow Up Date 05/11/21    Patient Goals/Self-Care Activities: Over the next 120 days Participation in mental health treatment encouraged Strategies to manage emotional triggers promoted Practice positive thinking and self-talk Follow up on insurance options (CAFA) Contact office with any questions or concerns Attend all scheduled appointments with providers        Patient verbalizes understanding of instructions provided today and agrees to view in [place].   Telephone follow up appointment with care management team member scheduled for:05/11/21  [name], MSW, [email or web address] Phone [phone] 5:24 AM

## 2021-05-04 NOTE — Chronic Care Management (AMB) (Signed)
Care Management Clinical Social Work Note  05/04/2021 Name: Sean Blake MRN: [id number] DOB: July 11, 1971  Sean Blake is a 50 y.o. year old male who is a primary care patient of Sean Hauser, DO.  The Care Management team was consulted for assistance with chronic disease management and coordination needs.  Engaged with patient by telephone for initial visit in response to provider referral for social work chronic care management and care coordination services  Consent to Services:  Sean Blake was given information about Care Management services today including:  Care Management services includes personalized support from designated clinical staff supervised by his physician, including individualized plan of care and coordination with other care providers 24/7 contact phone numbers for assistance for urgent and routine care needs. The patient may stop case management services at any time by phone call to the office staff.  Patient agreed to services and consent obtained.   Assessment: Review of patient past medical history, allergies, medications, and health status, including review of relevant consultants reports was performed today as part of a comprehensive evaluation and provision of chronic care management and care coordination services.  SDOH (Social Determinants of Health) assessments and interventions performed:    Advanced Directives Status: Not addressed in this encounter.  Care Plan  Allergies  Allergen Reactions   Gabapentin Palpitations    Outpatient Encounter Medications as of 04/27/2021  Medication Sig Note   ibuprofen (ADVIL) 200 MG tablet Take 200 mg by mouth every 6 (six) hours as needed. 04/19/2021: Reports taking 2 tablets (400 mg) twice daily as needed   meloxicam (MOBIC) 15 MG tablet Take 1 tablet (15 mg total) by mouth daily as needed for pain (arthritis joint pain). (Patient not taking: Reported on 04/19/2021)    No facility-administered  encounter medications on file as of 04/27/2021.    There are no problems to display for this patient.   Conditions to be addressed/monitored: Anxiety and Depression; Financial constraints related to medical coverage, Limited social support, Housing barriers, and Mental Health Concerns   Care Plan : General Social Work (Adult)  Updates made by Sean Chesterfield, LCSW since 05/04/2021 12:00 AM     Problem: Barriers to Treatment      Goal: Barriers to Treatment Identified and Managed   Start Date: 04/27/2021  This Visit's Progress: On track  Priority: High  Note:   Current barriers:   Financial strain, Limited social support, Housing barriers, and Mental Health Concerns  Needs Support, Education, and Care Coordination in order to meet unmet mental health needs. Clinical Goal(s): demonstrate a reduction in symptoms related to :Anxiety  and Depression  explore community resource options for unmet needs related [id number] constraints and No health insurance    Clinical Interventions:  1:1 collaboration with primary care provider regarding development and update of comprehensive plan of care as evidenced by provider attestation and co-signature Inter-disciplinary care team collaboration (Blake longitudinal plan of care) Assessed patient's previous and current treatment, coping skills, support system and barriers to care  Review various resources, discussed options and provided patient information about Various insurance options ( [place] Card, [place] and [place]) Depression screen reviewed , Solution-Focused Strategies, Mindfulness or Psychologist, educational, Active listening / Reflection utilized , Emotional Supportive Provided, Provided psychoeducation for mental health needs , Verbalization of feelings encouraged , and Research officer, political party / information provided   Patient Goals/Self-Care Activities: Over the next 120 days Participation in mental health treatment  encouraged Strategies to manage emotional  triggers promoted Practice positive thinking and self-talk Follow up on insurance options (CAFA) Contact office with any questions or concerns Attend all scheduled appointments with providers       Sean Blake, MSW, [email or web address] Phone [phone] 5:23 AM

## 2021-05-11 ENCOUNTER — Ambulatory Visit: Payer: Medicaid Other | Admitting: Licensed Clinical Social Worker

## 2021-05-13 NOTE — Chronic Care Management (AMB) (Signed)
Care Management Clinical Social Work Note  05/13/2021 Name: Sean Blake MRN: [id number] DOB: 1971/07/19  Sean Blake is a 50 y.o. year old male who is a primary care patient of [name], DO.  The Care Management team was consulted for assistance with chronic disease management and coordination needs.  Engaged with patient by telephone for follow up visit in response to provider referral for social work chronic care management and care coordination services  Consent to Services:  [name] was given information about Care Management services today including:  Care Management services includes personalized support from designated clinical staff supervised by his physician, including individualized plan of care and coordination with other care providers 24/7 contact phone numbers for assistance for urgent and routine care needs. The patient may stop case management services at any time by phone call to the office staff.  Patient agreed to services and consent obtained.   Consent to Services:  The patient was given information about Care Management services, agreed to services, and gave verbal consent prior to initiation of services.  Please see initial visit note for detailed documentation.   Patient agreed to services today and consent obtained.   Assessment: Engaged with patient by phone in response to provider referral for social work care coordination services: [employer] , Mental Health Counseling and Resources, and Financial Difficulties related to obtaining medications .    Patient is making progress with management of symptoms , but continues to have difficulty with stress triggered by upcoming court case . CCM LCSW discussed healthy strategies to assist with stress management, in addition, to community resources. See Care Plan below for interventions and patient self-care activities.  Recent life changes or stressors: Financial Strain, housing, upcoming  court case  Recommendation: Patient may benefit from, and is in agreement work with LCSW to address care coordination needs and will continue to work with the clinical team to address health care and disease management related needs.   Follow up Plan: Patient would like continued follow-up from CCM LCSW .  per patient's request will follow up in 05/25/21.  Will call office if needed prior to next encounter.  SDOH (Social Determinants of Health) assessments and interventions performed:    Advanced Directives Status: Not addressed in this encounter.  Care Plan  Allergies  Allergen Reactions   Gabapentin Palpitations    Outpatient Encounter Medications as of 05/11/2021  Medication Sig Note   ibuprofen (ADVIL) 200 MG tablet Take 200 mg by mouth every 6 (six) hours as needed. 04/19/2021: Reports taking 2 tablets (400 mg) twice daily as needed   meloxicam (MOBIC) 15 MG tablet Take 1 tablet (15 mg total) by mouth daily as needed for pain (arthritis joint pain). (Patient not taking: Reported on 04/19/2021)    No facility-administered encounter medications on file as of 05/11/2021.    There are no problems to display for this patient.   Conditions to be addressed/monitored: Anxiety, Depression, and Homelessness; Financial constraints related to obtaining medication, Limited social support, Housing barriers, and Mental Health Concerns   Care Plan : LCSW Plan of Care  Updates made by [name], LCSW since 05/13/2021 12:00 AM     Problem: Barriers to Treatment      Goal: Barriers to Treatment Identified and Managed   Start Date: 04/27/2021  This Visit's Progress: On track  Recent Progress: On track  Priority: High  Note:   Current barriers:   Financial strain, Limited social support, Housing barriers, and  Mental Health Concerns  Needs Support, Education, and Care Coordination in order to meet unmet mental health needs. Clinical Goal(s): demonstrate a reduction in symptoms related  to :Anxiety  and Depression  explore community resource options for unmet needs related [id number] constraints and No health insurance    Clinical Interventions:  Assessed patient's previous and current treatment, coping skills, support system and barriers to care  Patient reports stress triggered by upcoming court date scheduled 05/26/21 regarding a case from a couple of years ago. Patient agreed to contact the DA to request assistance in dismissing the charges. Patient reports that he is working on a way to travel to [facility] where the case is pending Patient reports that he is interested in establishing counseling to address symptoms of depression and anxiety triggered by extensive trauma and grief. CCM LCSW discussed local agencies that provide counseling and medication management. Resources will be provided via e-mail for future reference Patient reports difficulty affording his medications. CCM LCSW provided information on [facility] Patient will [facility] when state I.D. arrives 09/13: Patient reports that his state ID came in the mail and he will submit application this week Patient reports that he received CAFA application via e-mail [phone]: CCM LCSW mailed a print out of CAFA application, per patient request. CCM LCSW reviewed required documents associated with application Patient has completed intake for Disability Claim. States he has not heard back from [place]. Patient agreed to obtain contact information and provide to CCM LCSW to strengthen support regarding next steps CCM LCSW discussed benefits of obtaining assistance from Legal Aid to assist with renewal of SNAP benefits. LCSW will complete Legal Aid Referral Review various resources, discussed options and provided patient information about Various insurance options ( [place] Card, [place] and [place]) Depression screen reviewed ,  Solution-Focused Strategies, Mindfulness or Psychologist, educational, Active listening / Reflection utilized , Emotional Supportive Provided, Provided psychoeducation for mental health needs , Verbalization of feelings encouraged , and Research officer, political party / information provided   CCM LCSW reviewed upcoming appointments  1:1 collaboration with primary care provider regarding development and update of comprehensive plan of care as evidenced by provider attestation and co-signature Inter-disciplinary care team collaboration (see longitudinal plan of care) Patient Goals/Self-Care Activities: Over the next 120 days Participation in mental health treatment encouraged Strategies to manage emotional triggers promoted Practice positive thinking and self-talk Follow up on insurance options (CAFA) Contact office with any questions or concerns Attend all scheduled appointments with providers      [name], MSW, [email or web address] Phone [phone] 9:56 AM

## 2021-05-13 NOTE — Patient Instructions (Signed)
Visit Information   Goals Addressed             This Visit's Progress    Obtain Supportive Resources   On track    Timeframe:  Long-Range Goal Priority:  High Start Date:          04/27/21                   Expected End Date:      06/28/21                 Follow Up Date 05/25/21    Patient Goals/Self-Care Activities: Over the next 120 days Participation in mental health treatment encouraged Strategies to manage emotional triggers promoted Practice positive thinking and self-talk Follow up on insurance options (CAFA) Contact office with any questions or concerns Attend all scheduled appointments with providers        Patient verbalizes understanding of instructions provided today and agrees to view in [place].   Telephone follow up appointment with care management team member scheduled for:05/25/21  [name], MSW, [email or web address] Phone [phone] 10:00 AM

## 2021-05-17 ENCOUNTER — Ambulatory Visit: Payer: Medicaid Other | Admitting: Pharmacist

## 2021-05-17 NOTE — Patient Instructions (Signed)
Thank you allowing the Care Management Team to be a part of your care! It was a pleasure speaking with you today!     Care Management Team    Noreene Larsson RN, MSN, CCM Nurse Care Coordinator  [phone]   Wallace Cullens, PharmD  Clinical Pharmacist  [phone]   [name], MSW, LCSW Clinical Social Worker [phone]   Visit Information   Goals Addressed             This Visit's Progress    Pharmacy Goals       It was a pleasure speaking with you today!  Please follow up with Medication Management Clinic  Phone: [phone] Address: [address], [place], [place] [address] Mailing Address: [address], [place], [place] [address]  Feel free to call me with any questions or concerns.    Wallace Cullens, PharmD, [place], CPP Clinical Pharmacist [facility] [phone]        The patient verbalized understanding of instructions, educational materials, and care plan provided today and declined offer to receive copy of patient instructions, educational materials, and care plan.   The care management team will reach out to the patient again over the next 90 days.

## 2021-05-17 NOTE — Chronic Care Management (AMB) (Signed)
Care Management   Pharmacy Note  05/17/2021 Name: Sean Blake MRN: [id number] DOB: 18-Jul-1971  Subjective: Sean Blake is a 50 y.o. year old male who is a primary care patient of [name], DO. The Care Management team was consulted for assistance with care management and care coordination needs.    Engaged with patient by telephone for follow up visit in response to provider referral for pharmacy case management and/or care coordination services.   The patient was given information about Care Management services today including:  Care Management services includes personalized support from designated clinical staff supervised by the patient's primary care provider, including individualized plan of care and coordination with other care providers. 24/7 contact phone numbers for assistance for urgent and routine care needs. The patient may stop case management services at any time by phone call to the office staff.  Patient agreed to services and consent obtained.  Assessment:  Review of patient status, including review of consultants reports, laboratory and other test data, was performed as part of comprehensive evaluation and provision of chronic care management services.   SDOH (Social Determinants of Health) assessments and interventions performed:  none  Objective:  Lab Results  Component Value Date   CREATININE 0.83 05/29/2018    Care Plan  Allergies  Allergen Reactions   Gabapentin Palpitations    Medications Reviewed Today     Reviewed by Vanita Ingles (Case Manager) on 04/22/21 at 1359  Med List Status: <None>   Medication Order Taking? Sig Documenting Provider Last Dose Status Informant  ibuprofen (ADVIL) 200 MG tablet [id number] No Take 200 mg by mouth every 6 (six) hours as needed. [provider] Taking Active            Med Note Curley Spice, [facility] A   Mon Apr 19, 2021 10:31 AM) Reports taking 2 tablets (400 mg) twice daily as  needed  meloxicam (MOBIC) 15 MG tablet [id number] No Take 1 tablet (15 mg total) by mouth daily as needed for pain (arthritis joint pain).  Patient not taking: Reported on 04/19/2021   [name], DO Not Taking Active              Conditions to be addressed/monitored: osteoarthritis  Care Plan : PharmD - Medication Access  Updates made by [name], RPH-CPP since 05/17/2021 12:00 AM     Problem: Disease Progression      Long-Range Goal: Disease Progression Prevented or Minimized   Start Date: 04/19/2021  Expected End Date: 07/18/2021  Recent Progress: On track  Priority: High  Note:   Current Barriers:  Unable to independently afford treatment regimen  Pharmacist Clinical Goal(s):  Over the next 90 days, patient will verbalize ability to afford treatment regimen through collaboration with PharmD and provider.    Interventions: 1:1 collaboration with [name], DO regarding development and update of comprehensive plan of care as evidenced by provider attestation and co-signature Inter-disciplinary care team collaboration (see longitudinal plan of care)  Coordination of Care: Patient reports he has not yet submitted application for [facility], but that he received his State ID last week and is now able to provide this information for the application. Reports will print out financial documentation and work on getting documents to [facility] today  Medication Assistance: Patient reports meloxicam is unaffordable due to his lack of prescription coverage Based on patient's age, lack of prescription coverage and reported income, patient would meet requirements to  apply for [facility]). Confirm patient has [facility] phone number [phone]) and encourage him to follow up today about completing clinic application process Patient requests CM Pharmacist call back in a  couple of months to check in on him  Patient Goals/Self-Care Activities Over the next 90 days, patient will:  - collaborate with provider on medication access solutions  Follow Up Plan: The care management team will reach out to the patient again over the next 90 days.        Follow Up:  Patient agrees to Care Plan and Follow-up.  Wallace Cullens, PharmD, [place], CPP Clinical Pharmacist [facility] [phone]

## 2021-05-19 ENCOUNTER — Other Ambulatory Visit: Payer: Self-pay

## 2021-05-19 ENCOUNTER — Emergency Department
Admission: EM | Admit: 2021-05-19 | Discharge: 2021-05-19 | Disposition: A | Discharge status: Home / Self Care | Payer: Medicaid Other | Attending: Emergency Medicine | Admitting: Emergency Medicine

## 2021-05-19 ENCOUNTER — Emergency Department: Payer: Medicaid Other

## 2021-05-19 DIAGNOSIS — E860 Dehydration: Secondary | ICD-10-CM | POA: Insufficient documentation

## 2021-05-19 DIAGNOSIS — F17210 Nicotine dependence, cigarettes, uncomplicated: Secondary | ICD-10-CM | POA: Insufficient documentation

## 2021-05-19 DIAGNOSIS — R42 Dizziness and giddiness: Secondary | ICD-10-CM

## 2021-05-19 DIAGNOSIS — Z9660 Presence of unspecified orthopedic joint implant: Secondary | ICD-10-CM | POA: Insufficient documentation

## 2021-05-19 DIAGNOSIS — R55 Syncope and collapse: Secondary | ICD-10-CM | POA: Insufficient documentation

## 2021-05-19 LAB — URINALYSIS, COMPLETE (UACMP) WITH MICROSCOPIC
Bacteria, UA: NONE SEEN
Bilirubin Urine: NEGATIVE
Glucose, UA: NEGATIVE mg/dL
Hgb urine dipstick: NEGATIVE
Ketones, ur: NEGATIVE mg/dL
Nitrite: NEGATIVE
Protein, ur: 100 mg/dL — AB
Specific Gravity, Urine: 1.017 (ref 1.005–1.030)
pH: 5 (ref 5.0–8.0)

## 2021-05-19 LAB — BASIC METABOLIC PANEL
Anion gap: 13 (ref 5–15)
BUN: 22 mg/dL — ABNORMAL HIGH (ref 6–20)
CO2: 22 mmol/L (ref 22–32)
Calcium: 9.2 mg/dL (ref 8.9–10.3)
Chloride: 101 mmol/L (ref 98–111)
Creatinine, Ser: 1.47 mg/dL — ABNORMAL HIGH (ref 0.61–1.24)
GFR, Estimated: 57 mL/min — ABNORMAL LOW (ref >60)
Glucose, Bld: 102 mg/dL — ABNORMAL HIGH (ref 70–99)
Potassium: 3.7 mmol/L (ref 3.5–5.1)
Sodium: 136 mmol/L (ref 135–145)

## 2021-05-19 LAB — CBC
HCT: 44.4 % (ref 39.0–52.0)
Hemoglobin: 15.6 g/dL (ref 13.0–17.0)
MCH: 32.1 pg (ref 26.0–34.0)
MCHC: 35.1 g/dL (ref 30.0–36.0)
MCV: 91.4 fL (ref 80.0–100.0)
Platelets: 285 10*3/uL (ref 150–400)
RBC: 4.86 MIL/uL (ref 4.22–5.81)
RDW: 13.2 % (ref 11.5–15.5)
WBC: 11.7 10*3/uL — ABNORMAL HIGH (ref 4.0–10.5)
nRBC: 0 % (ref 0.0–0.2)

## 2021-05-19 LAB — TROPONIN I (HIGH SENSITIVITY)
Troponin I (High Sensitivity): 10 ng/L (ref <18)
Troponin I (High Sensitivity): 8 ng/L (ref <18)

## 2021-05-19 LAB — CBG MONITORING, ED: Glucose-Capillary: 96 mg/dL (ref 70–99)

## 2021-05-19 MED ORDER — LACTATED RINGERS IV BOLUS
1000.0000 mL | Freq: Once | INTRAVENOUS | Status: AC
  Administered 2021-05-19: 1000 mL via INTRAVENOUS

## 2021-05-19 NOTE — ED Provider Notes (Signed)
Emergency Medicine Provider Triage Evaluation Note  Sean Blake , a 50 y.o. male  was evaluated in triage.  Pt complains of dizziness, nausea, blurred vision and near syncope that started earlier today Recent diagnoses of cancer-still being worked up for specifics.  Review of Systems  Positive: Dizziness, nausea, blurred vision Negative: Fever, nausea, vomiting  Physical Exam  BP (!) 80/54 (BP Location: Left Arm)   Pulse (!) 104   Temp 98.2 F (36.8 C) (Oral)   Resp 20   SpO2 97%  Gen:   Awake, no distress   Resp:  Normal effort  MSK:   Moves extremities without difficulty  Other:    Medical Decision Making  Medically screening exam initiated at 2:51 PM.  Appropriate orders placed.  [name] was informed that the remainder of the evaluation will be completed by another provider, this initial triage assessment does not replace that evaluation, and the importance of remaining in the ED until their evaluation is complete.    Victorino Dike, FNP 05/20/21 1450    Lucrezia Starch, MD 05/20/21 2220

## 2021-05-19 NOTE — ED Provider Notes (Signed)
[facility] Emergency Department Provider Note ____________________________________________   Event Date/Time   First MD Initiated Contact with Patient 05/19/21 1509     (approximate)  I have reviewed the triage vital signs and the nursing notes.  HISTORY  Chief Complaint Near Syncope   HPI Sean Blake is a 50 y.o. malewho presents to the ED for evaluation of near syncope.  Chart review indicates obese patient with minimal medical history. Reports one half PPD smoking history.  Reports he is largely homeless, sometimes sleeping in his truck, sometimes sleeping at a friend's house.  He has been with his friend for the past week or so and has not been completely undomiciled recently.  Patient ports he last ate food yesterday, eating some mac & cheese, without emesis or abdominal pain.  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  He reports mechanical "was trying to rip me off" and that he was outside for a while trying to arrange work, and then getting his truck loaded onto a tow truck.   Reports feeling increasing presyncopal lightheaded dizziness, diffuse blurry vision throughout his visual fields, nausea and feeling weak in a generalized fashion.  Reports laying down on his back, with improved symptoms, but nonnormal was called.  Reports he felt a little bit better with EMS prehospital 500 cc of fluid, but still has some residual dizziness with position change.  Denies any focal pain, including chest pain, headache or abdominal pain.  Denies any complete syncopal episodes.  Denies recent illnesses, fevers.  Past Medical History:  Diagnosis Date   Anxiety    Arthritis     There are no problems to display for this patient.   Past Surgical History:  Procedure Laterality Date   JOINT REPLACEMENT      Prior to Admission  medications   Medication Sig Start Date End Date Taking? Authorizing Provider  ibuprofen (ADVIL) 200 MG tablet Take 200 mg by mouth every 6 (six) hours as needed.    [provider]  meloxicam (MOBIC) 15 MG tablet Take 1 tablet (15 mg total) by mouth daily as needed for pain (arthritis joint pain). Patient not taking: Reported on 04/19/2021 04/13/21   [name], DO    Allergies Gabapentin  No family history on file.  Social History Social History   Tobacco Use   Smoking status: Every Day    Packs/day: 0.50    Years: 30.00    Pack years: 15.00    Types: Cigarettes   Smokeless tobacco: Never  Substance Use Topics   Alcohol use: Not Currently    Alcohol/week: 4.0 standard drinks    Types: 4 Standard drinks or equivalent per week   Drug use: Not Currently    Types: Marijuana    Review of Systems  Constitutional: No fever/chills.  Positive generalized weakness and presyncope. Eyes: No visual changes. ENT: No sore throat. Cardiovascular: Denies chest pain. Respiratory: Denies shortness of breath. Gastrointestinal: No abdominal pain.  No nausea, no vomiting.  No diarrhea.  No constipation. Genitourinary: Negative for dysuria. Musculoskeletal: Negative for back pain. Skin: Negative for rash. Neurological: Negative for headaches, focal weakness or numbness.  Positive for blurry vision throughout his visual fields, resolved  ____________________________________________   PHYSICAL EXAM:  VITAL SIGNS: Vitals:   05/19/21 1445 05/19/21 1635  BP: (!) 80/54 126/73  Pulse: (!) 104 75  Resp: 20  13  Temp: 98.2 F (36.8 C) 98.7 F (37.1 C)  SpO2: 97% 98%     Constitutional: Alert and oriented. Well appearing and in no acute distress.  Obese, pleasant and conversational in full sentences. Eyes: Conjunctivae are normal. PERRL. EOMI. Head: Atraumatic. Nose: No congestion/rhinnorhea. Mouth/Throat: Mucous membranes are dry.  Oropharynx  non-erythematous. Neck: No stridor. No cervical spine tenderness to palpation. Cardiovascular: Tachycardic rate, regular rhythm. Grossly normal heart sounds.  Good peripheral circulation. Respiratory: Normal respiratory effort.  No retractions. Lungs CTAB. Gastrointestinal: Soft , nondistended, nontender to palpation. No CVA tenderness. Musculoskeletal: No lower extremity tenderness nor edema.  No joint effusions. No signs of acute trauma. Neurologic:  Normal speech and language. No gross focal neurologic deficits are appreciated.  Cranial nerves II through XII intact 5/5 strength and sensation in all 4 extremities Skin:  Skin is warm, dry and intact. No rash noted. Psychiatric: Mood and affect are normal. Speech and behavior are normal.  ____________________________________________   LABS (all labs ordered are listed, but only abnormal results are displayed)  Labs Reviewed  BASIC METABOLIC PANEL - Abnormal; Notable for the following components:      Result Value   Glucose, Bld 102 (*)    BUN 22 (*)    Creatinine, Ser 1.47 (*)    GFR, Estimated 57 (*)    All other components within normal limits  CBC - Abnormal; Notable for the following components:   WBC 11.7 (*)    All other components within normal limits  URINALYSIS, COMPLETE (UACMP) WITH MICROSCOPIC  CBG MONITORING, ED  TROPONIN I (HIGH SENSITIVITY)  TROPONIN I (HIGH SENSITIVITY)   ____________________________________________  12 Lead EKG  Sinus rhythm the rate of 62 bpm.  Normal axis and intervals.  No evidence of acute ischemia. ____________________________________________  RADIOLOGY  ED MD interpretation: 2 view CXR reviewed by me without evidence of acute cardiopulmonary pathology.  Official radiology report(s): DG Chest 2 View  Result Date: 05/19/2021 CLINICAL DATA:  Shortness of breath and dizziness. EXAM: CHEST - 2 VIEW COMPARISON:  May 19, 2006 FINDINGS: The heart size and mediastinal contours are  within normal limits. Both lungs are clear. The visualized skeletal structures are unremarkable. IMPRESSION: No active cardiopulmonary disease. Electronically Signed   By: Virgina Norfolk M.D.   On: 05/19/2021 16:25    ____________________________________________   PROCEDURES and INTERVENTIONS  Procedure(s) performed (including Critical Care):  Procedures  Medications  lactated ringers bolus 1,000 mL (0 mLs Intravenous Stopped 05/19/21 1737)    ____________________________________________   MDM / ED COURSE   50 year old male who is poorly domiciled presents to the ED with near syncope, likely due to dehydration and poor p.o. intake, amenable to outpatient management.  Presents tachycardic and with soft pressures, resolving with p.o. and IV fluids.  Vitals otherwise normal on room air.  Exam with stigmata of slightly decreased renal dysfunction.  Mild leukocytosis is noted, but I seriously doubt sepsis or acute infectious pathology considering his lack of additional symptoms.  No evidence of CAP or ACS.  No cardiac dysrhythmias noted.  Tolerating p.o. intake and ambulating at his baseline without symptoms after rehydration.  We will discharge with return precautions.   Clinical Course as of 05/19/21 1743  Wed May 19, 2021  1723 Reassessed.  Patient reports feeling better.  He gets up independently and ambulates to the restroom without symptoms.  Reports he is no longer "swimmy headed."  We discussed dehydration, outpatient management and return precautions for the ED. [DS]  Clinical Course User Index [DS] Vladimir Crofts, MD    ____________________________________________   FINAL CLINICAL IMPRESSION(S) / ED DIAGNOSES  Final diagnoses:  Near syncope  Dehydration  Dizziness     ED Discharge Orders     None        Levone Otten Tamala Julian   Note:  This document was prepared using Dragon voice recognition software and may include unintentional dictation errors.    Vladimir Crofts,  MD 05/19/21 [phone]

## 2021-05-19 NOTE — ED Triage Notes (Signed)
Pt comes into the ED via EMS from the side of the road , pt was in his car and pulled off and called 911.. pt states he has been feeling dizzy nausea, blurred vision. Upon there arrival pt was in a laying position, states when he stood he started sweating.has not eaten today  #18gLHand 543ml NS infused CBG115 97.9 temp 80HR 99%RA 115/75 111/57

## 2021-05-25 ENCOUNTER — Ambulatory Visit: Payer: Medicaid Other | Admitting: Licensed Clinical Social Worker

## 2021-05-25 NOTE — Patient Instructions (Signed)
Visit Information   Goals Addressed             This Visit's Progress    Obtain Supportive Resources   On track    Timeframe:  Long-Range Goal Priority:  High Start Date:          04/27/21                   Expected End Date:      06/28/21                 Follow Up Date 06/09/21    Patient Goals/Self-Care Activities: Over the next 120 days Participation in mental health treatment encouraged Strategies to manage emotional triggers promoted Practice positive thinking and self-talk Follow up on insurance options (CAFA) Contact office with any questions or concerns Attend all scheduled appointments with providers        Patient verbalizes understanding of instructions provided today and agrees to view in [place].   Telephone follow up appointment with care management team member scheduled for:06/09/21  Sean Blake, MSW, [email or web address] Phone [phone] 12:42 PM

## 2021-05-25 NOTE — Chronic Care Management (AMB) (Signed)
Care Management Clinical Social Work Note  05/25/2021 Name: Sean Blake MRN: [id number] DOB: Apr 07, 1971  Sean Blake is a 50 y.o. year old male who is a primary care patient of Sean Hauser, DO.  The Care Management team was consulted for assistance with chronic disease management and coordination needs.  Engaged with patient by telephone for follow up visit in response to provider referral for social work chronic care management and care coordination services  Consent to Services:  Sean Blake was given information about Care Management services today including:  Care Management services includes personalized support from designated clinical staff supervised by his physician, including individualized plan of care and coordination with other care providers 24/7 contact phone numbers for assistance for urgent and routine care needs. The patient may stop case management services at any time by phone call to the office staff.  Patient agreed to services and consent obtained.   Consent to Services:  The patient was given information about Care Management services, agreed to services, and gave verbal consent prior to initiation of services.  Please see initial visit note for detailed documentation.   Patient agreed to services today and consent obtained.   Assessment: Engaged with patient by phone in response to provider referral for social work care coordination services: Transportation Needs , [employer] , [place] and Resources, and Financial Difficulties related to medical coverage .    Patient continues to maintain positive progress with care plan goals. Patient plans to mail CAFA application to PCP office for CCM LCSW to review and will complete [facility] application when he obtains his car. Patient is interested in a referral to [facility], once he is approved for the [employer]. See Care Plan below for interventions and  patient self-care actives.  Recent life changes or stressors: Medical Coverage, Management of health conditions  Recommendation: Patient may benefit from, and is in agreement work with LCSW to address care coordination needs and will continue to work with the clinical team to address health care and disease management related needs.   Follow up Plan: Patient would like continued follow-up from CCM LCSW .  per patient's request will follow up in 06/09/21.  Will call office if needed prior to next encounter.  SDOH (Social Determinants of Health) assessments and interventions performed:    Advanced Directives Status: Not addressed in this encounter.  Care Plan  Allergies  Allergen Reactions   Gabapentin Palpitations    Outpatient Encounter Medications as of 05/25/2021  Medication Sig Note   ibuprofen (ADVIL) 200 MG tablet Take 200 mg by mouth every 6 (six) hours as needed. 04/19/2021: Reports taking 2 tablets (400 mg) twice daily as needed   meloxicam (MOBIC) 15 MG tablet Take 1 tablet (15 mg total) by mouth daily as needed for pain (arthritis joint pain). (Patient not taking: Reported on 04/19/2021)    No facility-administered encounter medications on file as of 05/25/2021.    There are no problems to display for this patient.   Conditions to be addressed/monitored: Anxiety and Depression; Financial constraints related to medical coverage, Limited social support, Transportation, Housing barriers, and Mental Health Concerns   Care Plan : LCSW Plan of Care  Updates made by Sean Chesterfield, LCSW since 05/25/2021 12:00 AM     Problem: Barriers to Treatment      Goal: Barriers to Treatment Identified and Managed   Start Date: 04/27/2021  This Visit's Progress: On track  Recent Progress: On track  Priority: High  Note:   Current barriers:   Financial strain, Limited social support, Housing barriers, and Mental Health Concerns  Needs Support, Education, and Care Coordination in order  to meet unmet mental health needs. Clinical Goal(s): demonstrate a reduction in symptoms related to :Anxiety  and Depression  explore community resource options for unmet needs related [id number] constraints and No health insurance    Clinical Interventions:  Assessed patient's previous and current treatment, coping skills, support system and barriers to care  Patient reports stress triggered by upcoming court date scheduled 05/26/21 regarding a case from a couple of years ago. Patient agreed to contact the DA to request assistance in dismissing the charges. Patient reports that he is working on a way to travel to [facility] where the case is pending Patient reports that he is interested in establishing counseling to address symptoms of depression and anxiety triggered by extensive trauma and grief. CCM LCSW discussed local agencies that provide counseling and medication management. Resources will be provided via e-mail for future reference 09/27: Patient prefers to obtain psychiatric and/or counseling through [facility]. Prefers to complete CAFA application and have PCP complete referral to [facility] Patient reports difficulty affording his medications. CCM LCSW provided information on [facility] Patient will submit application to [facility] when state I.D. arrives 09/13: Patient reports that his state ID came in the mail and he will submit application this week 51/02: Patient reports that he has been without a vehicle for approximately one week. During this time, he has been unable to submit application to [facility] or drop off CAFA to PCP office. Patient agreed to mail CAFA application and supportive documentation to PCP office within this week Patient shared that he is feeling better after recent ED visit. He currently has money to pick up his meds. CCM LCSW informed him that they are still available and verified patient's pharmacy CCM LCSW  reviewed upcoming appointments Patient reports that he received CAFA application via e-mail 58/52: CCM LCSW mailed a print out of CAFA application, per patient request. CCM LCSW reviewed required documents associated with application Patient has completed intake for Disability Claim. States he has not heard back from [place]. Patient agreed to obtain contact information and provide to CCM LCSW to strengthen support regarding next steps CCM LCSW discussed benefits of obtaining assistance from Legal Aid to assist with renewal of SNAP benefits. LCSW will complete Legal Aid Referral Review various resources, discussed options and provided patient information about Various insurance options ( [employer], [place] [employer] [place]) Depression screen reviewed , Solution-Focused Strategies, Mindfulness or Psychologist, educational, Active listening / Reflection utilized , Emotional Supportive Provided, Provided psychoeducation for mental health needs , Verbalization of feelings encouraged , and Research officer, political party / information provided   CCM LCSW reviewed upcoming appointments  1:1 collaboration with primary care provider regarding development and update of comprehensive plan of care as evidenced by provider attestation and co-signature Inter-disciplinary care team collaboration (see longitudinal plan of care) Patient Goals/Self-Care Activities: Over the next 120 days Participation in mental health treatment encouraged Strategies to manage emotional triggers promoted Practice positive thinking and self-talk Follow up on insurance options (CAFA) Contact office with any questions or concerns Attend all scheduled appointments with providers     [name], MSW, [email or web address] Phone [phone] 12:42 PM

## 2021-05-27 ENCOUNTER — Telehealth: Payer: Medicaid Other | Admitting: General Practice

## 2021-05-27 ENCOUNTER — Ambulatory Visit: Payer: Self-pay

## 2021-05-27 DIAGNOSIS — F17200 Nicotine dependence, unspecified, uncomplicated: Secondary | ICD-10-CM

## 2021-05-27 DIAGNOSIS — R221 Localized swelling, mass and lump, neck: Secondary | ICD-10-CM

## 2021-05-27 DIAGNOSIS — F32A Depression, unspecified: Secondary | ICD-10-CM

## 2021-05-27 DIAGNOSIS — M159 Polyosteoarthritis, unspecified: Secondary | ICD-10-CM

## 2021-05-27 DIAGNOSIS — I10 Essential (primary) hypertension: Secondary | ICD-10-CM

## 2021-05-27 DIAGNOSIS — R296 Repeated falls: Secondary | ICD-10-CM

## 2021-05-27 DIAGNOSIS — F419 Anxiety disorder, unspecified: Secondary | ICD-10-CM

## 2021-05-27 DIAGNOSIS — F4321 Adjustment disorder with depressed mood: Secondary | ICD-10-CM

## 2021-05-27 NOTE — Chronic Care Management (AMB) (Signed)
Care Management    RN Visit Note  05/27/2021 Name: Sean Blake MRN: [id number] DOB: 03/31/1971  Subjective: Sean Blake is a 50 y.o. year old male who is a primary care patient of [name], DO. The care management team was consulted for assistance with disease management and care coordination needs.    Engaged with patient by telephone for follow up visit in response to provider referral for case management and/or care coordination services.   Consent to Services:   Mr. Apo was given information about Care Management services today including:  Care Management services includes personalized support from designated clinical staff supervised by his physician, including individualized plan of care and coordination with other care providers 24/7 contact phone numbers for assistance for urgent and routine care needs. The patient may stop case management services at any time by phone call to the office staff.  Patient agreed to services and consent obtained.   Assessment: Review of patient past medical history, allergies, medications, health status, including review of consultants reports, laboratory and other test data, was performed as part of comprehensive evaluation and provision of chronic care management services.   SDOH (Social Determinants of Health) assessments and interventions performed:    Care Plan  Allergies  Allergen Reactions   Gabapentin Palpitations    Outpatient Encounter Medications as of 05/27/2021  Medication Sig Note   ibuprofen (ADVIL) 200 MG tablet Take 200 mg by mouth every 6 (six) hours as needed. 04/19/2021: Reports taking 2 tablets (400 mg) twice daily as needed   meloxicam (MOBIC) 15 MG tablet Take 1 tablet (15 mg total) by mouth daily as needed for pain (arthritis joint pain). (Patient not taking: Reported on 04/19/2021)    No facility-administered encounter medications on file as of 05/27/2021.    There are no problems to display  for this patient.   Conditions to be addressed/monitored: HTN, Anxiety, Depression, and neck mass, grief, osteoarthritis pain, frequent falls, homelessness and a smoker  Care Plan : RNCM: Adult General plan of care for neck mass, HTN, grief, anxiety, depression, osteoarthritis pain, frequent falls, homeless, and smoker  Updates made by Vanita Ingles, RN since 05/27/2021 12:00 AM     Problem: RNCM: Adult General plan of care for neck mass, HTN, grief, anxiety, depression, osteoarthritis pain, frequent falls, homeless, and smoker   Priority: High     Long-Range Goal: RNCM: Adult General plan of care for neck mass, HTN, grief, anxiety, depression, osteoarthritis pain, frequent falls, homeless, and smoker   Start Date: 04/15/2021  Expected End Date: 04/15/2022  Recent Progress: On track  Priority: High  Note:   Current Barriers:  Knowledge Deficits related to plan of care for management of HTN, Homelessness, Anxiety with Excessive Worry, neck mass, osteoarthritis, high risk for falls, and smokes  Social Anxiety,, Depression: depressed mood, insomnia, fatigue, anxiety, loss of energy/fatigue, disturbed sleep, weight gain,, Grief, Insomnia/Sleep Difficulties, Stress at the loss of his son and other deaths within a 6 month period of time , and PTSD, and limited resources due to no income   Care Coordination needs related to Financial constraints related to homelessness and inability to afford the basic necessities , Limited social support, Limited access to food, Housing barriers, Level of care concerns, Medication procurement, ADL IADL limitations, Mental Health Concerns , Substance abuse issues -  smoker, occasional pot smoker, occasional alcohol use, Social Isolation, Lack of essential utilities - homeless*, and Lacks knowledge of community resource: needs help with finances,  medications cost, finding a place to stay (sleeps in his truck a lot), medical care for neck mass likely malignant)    Chronic Disease Management support and education needs related to HTN, Homelessness, Anxiety with Excessive Worry, Social Anxiety,, Depression: depressed mood, insomnia, fatigue, anxiety, disturbed sleep, weight gain,, Grief, Insomnia/Sleep Difficulties, and Stress at loss of son and others within a 6 month period of time, and chronic pain, falls,  smoker, and neck mass Lacks caregiver support.  Film/video editor.  Difficulty obtaining medications Homeless and neck mass highly suspicious for malignancy   RNCM Clinical Goal(s):  Patient will verbalize understanding of plan for management of HTN, Anxiety, Depression, Homelessness, and grief, neck mass, osteoarthritis, and high risk for falls verbalize basic understanding of HTN, Anxiety, Depression, Homelessness, and neck mass, osteoarthritis, grief, and smoker disease process and self health management plan  take all medications exactly as prescribed and will call provider for medication related questions demonstrate understanding of rationale for each prescribed medication work with the pharm D on cost constraints  attend all scheduled medical appointments: saw pcp on 04-13-2021, will follow up again with pcp in about one month demonstrate improved and ongoing adherence to prescribed treatment plan for HTN, Anxiety, Depression, Homelessness, and neck mass, osteoarthritis, smoker, and fall risk as evidenced by adherence to prescribed medication regimen contacting provider for new or worsened symptoms or questions working with the CCM team to assist with resources to help with maintaining health and well being   demonstrate improved and ongoing health management independence getting the assistance he needs to help with his multiple chronic conditions and establish essentials to improve health and well being  continue to work with RN Care Manager to address care management and care coordination needs related to HTN, Anxiety, Depression,  Homelessness, and neck mass, osteoarthritis, grief, smoker, and frequent falls  work with pharmacist to address Financial constraints related to being able to afford basic necessities , Limited social support, Limited access to food, Housing barriers, Level of care concerns, Medication procurement, ADL IADL limitations, Mental Health Concerns , Substance abuse issues -  smoker, occasional smoker of pot, occasional alcohol use, and Social Isolation related to HTN, Anxiety, Depression, Homelessness, and neck mass, grief, smoker, osteoarthritis, and high falls risk work with Education officer, museum to address Financial constraints related to the need to obtain housing, and resources to pay for chronic disease management,medications, Limited social support, Limited access to food, Housing barriers, Level of care concerns, ADL IADL limitations, Mental Health Concerns , Substance abuse issues -  smoker, alcohol use and pot use , and Social Isolation related to the management of HTN, Anxiety, Depression, Homelessness, and grief, neck mass, osteoarthritis, falls, and smoker work with [employer] care guide to address needs related to Financial constraints related to financial needs for housing, food, and health care, Limited social support, Limited access to food, Housing barriers, and Level of care concerns  demonstrate a decrease in HTN, Anxiety, Depression, Homelessness, and grief, neck mass, osteoarthritis, falls and smoker  exacerbations  demonstrate ongoing self health care management ability by working with the CCM team to meet needs expressed through collaboration with Consulting civil engineer, provider, and care team.   Interventions: 1:1 collaboration with primary care provider regarding development and update of comprehensive plan of care as evidenced by provider attestation and co-signature Inter-disciplinary care team collaboration (see longitudinal plan of care) Evaluation of current treatment plan related to  chronic disease management and  self management and patient's adherence to plan as  established by provider   SDOH Barriers (Status: Goal on track: YES.)  Patient interviewed and SDOH assessment performed        SDOH Interventions    Flowsheet Row Most Recent Value  SDOH Interventions   Housing Interventions Other (Comment)  [sleeps in his truck]  Physical Activity Interventions Other (Comments)  [limited mobility- cannot tolerated standing more than 10-15 mins]  Social Connections Interventions Other (Comment)  [has children and friends, good support system]  Transportation Interventions Other (Comment)  [sometimes sleeps in his truck]     Patient interviewed and appropriate assessments performed Referred patient to community resources care guide team for assistance with housing needs, food resources and community resources to assist with SDOH and basic needs. 04-22-2021: The patient is actively working with the CCM team and care guides to get what he needs to obtain medication, food resources, housing, and health care. [facility]. Turned in application yesterday for the housing authority. 05-27-2021: Continues to work on paperwork and working with the CCM team. Is making progress.  Provided mental health counseling with regard to grief, depression and anxiety (mental health diagnosis or concern) Provided patient with information about griefshare.org and sent information by email to the patient with CAFA. Instructed the patient to fill out and get needed documents for application to see if he could be approved for assistance in the [employer] system for multiple chronic conditions. 04-22-2021: Has filled out CAFA form and sent form back in to the email listed on the form.  Discussed plans with patient for ongoing care management follow up and provided patient with direct contact information for care management team Advised  patient to look for email with CAFA application, look for email with griefshare information, expect calls from the pharm D, the LCSW, the care guides, and ongoing support from the [facility], also advised the patient to call for questions, help, or concerns with filing out paperwork for CAFA.  Collaborated with RN Case Manager re: SDOH needs, Chronic disease management and care coordination needs  Collaborated with care guides  (community agency) re: housing, food resources, cost constraints, no income coming in at this time.  Assisted patient/caregiver with obtaining information about health plan benefits Provided education to patient/caregiver regarding level of care options. Referred patient to LCSW for long term follow up and therapy/counseling in relation to anxiety, depression, grief, and substance use    Falls:  (Status: Goal on track: YES.) Provided written and verbal education re: potential causes of falls and Fall prevention strategies Reviewed medications and discussed potential side effects of medications such as dizziness and frequent urination Advised patient of importance of notifying provider of falls Assessed for signs and symptoms of orthostatic hypotension. 05-27-2021: Review of hypotension. The patient was having hypotension at the time of his event on 05-19-2021 and being evaluated in the ER Assessed for falls since last encounter. 04-22-2021: Denies any new falls at this time. Will continue to monitor for changes. 05-27-2021: The patient had a near fall experience on 05-19-2021 when he got dehydrated and too hot. He states that he was evaluated in the ER. Got fluid resuscitation and discharged home. Is feeling better. Review of safety and staying hydrated.  Assessed patients knowledge of fall risk prevention secondary to previously provided education  Grief, depression, and anxiety   (Status: Goal on track: YES.) Evaluation of current treatment plan related to Anxiety, Depression,  Homelessness, and grief , Financial  constraints related to inability to afford health care, basic needs and medicaitons , Limited social support, Limited access to food, Housing barriers, Level of care concerns, ADL IADL limitations, Mental Health Concerns , Substance abuse issues -  smoker, alcohol use and pot use, and Social Isolation self-management and patient's adherence to plan as established by provider. 04-22-2021: Has filled out several applications for assistance with medications, housing, healthcare affordability and food resources. [facility] at the time of the call. Will continue to monitor for changes. 05-27-2021: Is getting paperwork processed and working on applications. Is working with the LCSW for help with effective management of mental health and well being.  Discussed plans with patient for ongoing care management follow up and provided patient with direct contact information for care management team Advised patient to fill out CAFA application and follow up with the SW through the Medicaid office to help with obtaining updated information and apply for full Medicaid. The patient only has family planning Medicaid; Provided education to patient re: griefshare.org information and local groups to help with his grief over the loss of his son and other family and friends over a 6 month period of time. ; Collaborated with LCSW regarding CAFA application and the RNCM being able to reach out sooner to the patient to assist with the needs expressed with pcp on 04-13-2021, follow up with the LCSW on 04-27-2021. 05-27-2021: Ongoing support and education with the LCSW and CCM team Provided patient and/or caregiver with housing, food resources, community resources information about care guides calling him for assistance with resources for [employer] 05-27-2021: Is currently working with the care guides for housing information, food  resources, and other community resources; Provided patient with grief support and CAFA educational materials related to grief and loss and financial assistance through [employer]; Care Guide referral for housing, food resources, and other resources to help the patient in [place]. 05-27-2021: Is currently working with the care guides for needed resources.  Social Work referral for program assistance, mental health counseling and support. Appointment with the LCSW on 04-27-2021. 05-27-2021: Actively working with the LCSW at this time.  Discussed plans with patient for ongoing care management follow up and provided patient with direct contact information for care management team; Screening for signs and symptoms of depression related to chronic disease state;  Assessed social determinant of health barriers;   Health Maintenance (Status: Goal on track: YES.)  Patient interviewed about adult health maintenance status including Depression screen    Falls risk assessment    Blood Pressure      Advised patient to discuss Depression screen    Falls risk assessment    Blood Pressure    with primary care provider       Hypertension: (Status: Goal on track: YES.) Last practice recorded BP readings:  BP Readings from Last 3 Encounters:  05/19/21 126/73  04/13/21 (!) 149/97  05/29/18 (!) 141/87  Most recent eGFR/CrCl: No results found for: EGFR  No components found for: CRCL  Evaluation of current treatment plan related to hypertension self management and patient's adherence to plan as established by provider.  05-27-2021: The patient denies any issues with HTN at this time. The patient is working on establishing permanent housing and getting help with needed health care cost. Seen in the ER recently for dehydration with hypotension. Is feeling better now;   Provided education to patient re: stroke prevention, s/s of  heart attack and stroke. 05-27-2021: Review of uncontrolled blood pressure and  the ill effects on body systems. Reviewed prescribed diet heart healthy diet. 05-27-2021: Adheres to heart healthy diet Reviewed medications with patient and discussed importance of compliance. 05-27-2021: States compliance with medications, working with pharm D;  Counseled on adverse effects of illicit drug and excessive alcohol use in patients with high blood pressure;  Discussed plans with patient for ongoing care management follow up and provided patient with direct contact information for care management team; Provided education on prescribed diet heart healthy diet, will send information by EMMI and my chart;  Discussed complications of poorly controlled blood pressure such as heart disease, stroke, circulatory complications, vision complications, kidney impairment, sexual dysfunction;   Pain:  (Status: Goal on track: YES.) Pain assessment performed- patient with osteoarthritis limiting him from doing his usual work at [employer] Medications reviewed- only takes Meloxicam 05-27-2021: Working with the [facility] to get Meloxicam. States Meloxicam helps with his pain management Reviewed provider established plan for pain management; Discussed importance of adherence to all scheduled medical appointments; Counseled on the importance of reporting any/all new or changed pain symptoms or management strategies to pain management provider; Advised patient to report to care team affect of pain on daily activities; Discussed use of relaxation techniques and/or diversional activities to assist with pain reduction (distraction, imagery, relaxation, massage, acupressure, TENS, heat, and cold application; Reviewed with patient prescribed pharmacological and nonpharmacological pain relief strategies; Pharm D consult to see if the patient can obtain [facility]    Neck mass  (Status: Goal on track: NO.) Evaluation of current treatment plan related to  neck mass , Financial  constraints related to no health insurance  and Level of care concerns self-management and patient's adherence to plan as established by provider. Discussed plans with patient for ongoing care management follow up and provided patient with direct contact information for care management team Advised patient to file out application for CAFA, the patient has no insurance and the neck mass is highly suspicious for malignancy and needs to be treated. The CCM team is working with the patient to get health care and treatment for chronic conditions. 05-27-2021: The patient is waiting to hear back from completed applications. [facility].  Provided education to patient re: CCM team and helping paitent achieve his health and wellness goals ; Collaborated with CCM team and pcp  regarding financial constraints and resources provided for the patient to obtain needed care for neck mass and other health related needs. ; Discussed plans with patient for ongoing care management follow up and provided patient with direct contact information for care management team;  Smoking Cessation: (Status: New goal. Goal on track: NO.) Reviewed smoking history:  tobacco abuse of 40 plus years; currently smoking 0.5 ppd Previous quit attempts, unsuccessful not any successful using has never tried   Reports smoking within 30 minutes of waking up Reports triggers to smoke include: anxiety, grief, depression, life stressors Reports motivation to quit smoking includes: not interested at this time On a scale of 1-10, reports MOTIVATION to quit is not interested at this time On a scale of 1-10, reports CONFIDENCE in quitting is Not interested at this time. Reviewed with the patient there are ways to help with smoking cessation when he is ready  Evaluation of current treatment plan reviewed; Advised patient to discuss smoking cessation options with provider; Provided contact information for  [place]  Quit Line (1-800-QUIT-NOW); Provided patient with printed smoking cessation educational materials; Discussed plans with patient for ongoing care management follow up and provided patient with direct contact information for care management team;  Patient Goals/Self-Care Activities: Patient will self administer medications as prescribed Patient will attend all scheduled provider appointments Patient will call pharmacy for medication refills Patient will attend church or other social activities Patient will continue to perform ADL's independently Patient will continue to perform IADL's independently Patient will call provider office for new concerns or questions Patient will work with BSW to address care coordination needs and will continue to work with the clinical team to address health care and disease management related needs.   Patient will obtain needed resources for SDOH to assist with maintaining health and well being       Plan: Telephone follow up appointment with care management team member scheduled for:  07-08-2021 at 18 pm   Noreene Larsson RN, MSN, [place] Mobile: [phone]

## 2021-05-27 NOTE — Patient Instructions (Signed)
Visit Information   Goals Addressed             This Visit's Progress    RNCM: Find Help in My Community       Timeframe:  Short-Term Goal Priority:  High Start Date:       04-15-2021                      Expected End Date:     08-17-2021                  Follow Up Date 07-08-2021    - begin a notebook of services in my neighborhood or community - call 211 when I need some help - follow-up on any referrals for help I am given - think ahead to make sure my need does not become an emergency - make a note about what I need to have by the phone or take with me, like an identification card or social security number have a back-up plan - have a back-up plan - make a list of family or friends that I can call    Why is this important?   Knowing how and where to find help for yourself or family in your neighborhood and community is an important skill.  You will want to take some steps to learn how.    Notes: [id number]: [employer] application sent to the patient today by email at [email or web address], careguide referral made for SDOH, will ask the pharm D to assist with medications assistance. LCSW to follow up with the patient on expressed needs. Will continue to work with the patient to get needed resources to help with chronic conditions. 04-22-2021: The patient has returned the [employer] application back in. [facility] Has resources in place from the care guide team. Is working on getting housing form himself. He is thankful for working with the CCM team to get the help he needs. Will continue to monitor. 05-27-2021: The patient states he is working on his paperwork and will bring by paperwork to the office on 05-28-2021. The patient is working with the LCSW as well for resources. States that he is taking one day at a time. Recent ER visit where he got too hot and also he was dehydrated. Review of resources.      RNCM: Lifestyle Change-Hypertension       Timeframe:   Long-Range Goal Priority:  High Start Date:   04-15-2021                          Expected End Date:    04-15-2022                   Follow Up Date 05/27/2022    - agree on reward when goals are met - agree to work together to make changes - ask questions to understand - have a family meeting to talk about healthy habits - learn about high blood pressure    Why is this important?   The changes that you are asked to make may be hard to do.  This is especially true when the changes are life-long.  Knowing why it is important to you is the first step.  Working on the change with your family or support person helps you not feel alone.  Reward yourself and family or support person when goals are met. This can be an activity you  choose like bowling, hiking, biking, swimming or shooting hoops.     Notes: 04-15-2021: The patient is currently not on any blood pressure medications. Established care this week with pcp. Expected follow up x one month 05-27-2021: The patient recently was in the ER for low blood pressures and dehydration. States he has never had issues with blood pressures before. Is feeling better now. Review of staying hydrated and monitoring for changes in his conditions.      RNCM: Make and Keep All Appointments       Timeframe:  Long-Range Goal Priority:  High Start Date:       04-15-2021                      Expected End Date:      04-15-2022                 Follow Up Date 07/08/2021    - call to cancel if needed - keep a calendar with prescription refill dates - keep a calendar with appointment dates    Why is this important?   Part of staying healthy is seeing the doctor for follow-up care.  If you forget your appointments, there are some things you can do to stay on track.    Notes: 04-15-2021: The patient has a truck and can get to his appointments. 04-22-2021: Also has friends that help with getting him to places  he needs to go. [facility]. 05-27-2021: His truck has been broken down but he states he has support from friends who will be able to take him to get paperwork completed.      RNCM: Manage My Emotions       Timeframe:  Long-Range Goal Priority:  High Start Date:     04-15-2021                        Expected End Date:   04-15-2022                    Follow Up Date 05-27-2021    - call and visit an old friend - join a support group - laugh; watch a funny movie or comedian - learn and use visualization or guided imagery - perform a random act of kindness - practice relaxation or meditation daily - talk about feelings with a friend, family or spiritual advisor - practice positive thinking and self-talk    Why is this important?   When you are stressed, down or upset, your body reacts too.  For example, your blood pressure may get higher; you may have a headache or stomachache.  When your emotions get the best of you, your body's ability to fight off cold and flu gets weak.  These steps will help you manage your emotions.     Notes: 04-15-2021: The patient has a lot of stressors in his life right now. Has had a lot of grief and loss over the last couple of years. His son died in 10/30/2018 and in a 6 month period of time he lost a best friend, the best friends son, his father.  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 Has moved back to [place]. Has a lot of health factors impacting his care. Is willing to work with the CCM team for support.  Resources given and talked with the patient about griefshare.org for grief support and education. 04-22-2021: The patient has accomplished a lot of things since last week. His [facility] was there [facility]. He has emailed his application back to [employer] and is working with the [facility]. The patient expressed thanks for the support of the CCM team.  05-27-2021: The patient has a lot going on but he is upbeat and positive. He appreciates the support of the CCM team and says this helps him stay focused. Talked with LCSW this week also. Encouraged the patient to talk about his feelings and get needed paperwork done so he can get the help he needs with his chronic conditions. Emotional support and education given.      RNCM: Manage My Medicine       Timeframe:  Long-Range Goal Priority:  High Start Date:       04-15-2021                      Expected End Date:        04-15-2022               Follow Up Date 07-08-2021    - call for medicine refill 2 or 3 days before it runs out - call if I am sick and can't take my medicine - keep a list of all the medicines I take; vitamins and herbals too - learn to read medicine labels - use a pillbox to sort medicine - use an alarm clock or phone to remind me to take my medicine    Why is this important?   These steps will help you keep on track with your medicines.   Notes: 04-15-2021: The patient states he has a hard time paying for his medications. The patient has a script for Meloxicam. Will do a referral to pharm D for assistance with medication management clinic. Will continue to monitor. 04-22-2021: The patient is working with the [facility] to get his Meloxicam. The patient is appreciative of all the things the CCM team and pcp are doing to help him. 05-27-2021: States that he has to go pick up his Meloxicam. He denies any issues with medications at this time. Will continue to monitor.      RNCM: Pain control       Timeframe:  Long-Range Goal Priority:  High Start Date:  04-15-2021                           Expected End Date:    04-15-2022                   Follow Up Date 07-08-2021    - call for medicine refill 2 or 3 days before it runs out - develop a personal pain management plan - keep track of prescription refills - plan exercise or activity when pain is best controlled - prioritize tasks for the  day - stay active    Why is this important?   Day-to-day life can be hard when you have back pain.  Pain medicine is just one piece of the treatment puzzle. There are many things you can do to manage pain and keep your back strong.   Lifestyle changes, like stopping smoking and eating foods with Vitamin D and calcium, keep your bones and muscles healthy. Your back is better when it is  supported by strong muscles.  You can try these action steps to help you manage your pain.     Notes: 04-15-2021: Has chronic osteoarthritis pain. Takes Meloxicam. 05-27-2021: Is working with the [facility] to get his Meloxicam. Is taking his medications as prescribed.     RNCM: Prevent Falls and Injury       Follow Up Date 07-08-2021    - add more outdoor lighting - always use handrails on the stairs - always wear low-heeled or flat shoes or slippers with nonskid soles - call the doctor if I am feeling too drowsy - install bathroom grab bars - join an exercise group in my community - keep a flashlight by the bed - keep my cell phone with me always - learn how to get back up if I fall - make an emergency alert plan in case I fall - pick up clutter from the floors - use a nonslip pad with throw rugs, or remove them completely - use a nightlight in the bathroom    Why is this important?   Most falls happen when it is hard for you to walk safely. Your balance may be off because of an illness. You may have pain in your knees, hip or other joints.  You may be overly tired or taking medicines that make you sleepy. You may not be able to see or hear clearly.  Falls can lead to broken bones, bruises or other injuries.  There are things you can do to help prevent falling.     Notes: 04-15-2021: The patient has frequent falls, mainly due to pain and discomfort from osteoarthritis. 04-22-2021: Denies any new falls since last outreach last week. Will continue to monitor. 05-27-2021: Had an almost fall on 05-19-2021 when he was  dehydrated and got too hot. Was evaluated in the ER and released after fluid resuscitation. The patient states he is feeling much better. Review of staying hydrated.         Patient verbalizes understanding of instructions provided today and agrees to view in [place].   Telephone follow up appointment with care management team member scheduled for: 07-08-2021 at 345 pm  Noreene Larsson RN, MSN, [place] Mobile: [phone]

## 2021-06-07 ENCOUNTER — Telehealth: Payer: Self-pay | Admitting: Family Medicine

## 2021-06-07 NOTE — Telephone Encounter (Signed)
   Telephone encounter was:  Successful.  06/07/2021 Name: Sean Blake MRN: [id number] DOB: 07/07/1971  Sean Blake is a 50 y.o. year old male who is a primary care patient of [name], DO . The community resource team was consulted for assistance with Food Insecurity and housing and disability.   Care guide performed the following interventions: Patient provided with information about care guide support team and interviewed to confirm resource needs Patient called me back and advised he had cancer and had applied for Medicaid and was denied. I applied for him a $355 gift certificate for [employer] and will follow up with him to see if he received it .  Follow Up Plan:  Care guide will follow up with patient by phone over the next week.  April Green Care Guide, Embedded Care Coordination [place], Care Management Phone: [phone] Email: [email or web address]

## 2021-06-08 ENCOUNTER — Telehealth: Payer: Self-pay

## 2021-06-08 NOTE — Chronic Care Management (AMB) (Signed)
  Care Management   Note  06/08/2021 Name: Sean Blake MRN: [id number] DOB: [birth year]  Sean Blake is a 50 y.o. year old male who is a primary care patient of [name], DO and is actively engaged with the care management team. I reached out to [name] by phone today to assist with re-scheduling a follow up visit with the Licensed Clinical Social Worker  Follow up plan: Unsuccessful telephone outreach attempt made. A HIPAA compliant phone message was left for the patient providing contact information and requesting a return call.  The care management team will reach out to the patient again over the next 5 days.  If patient returns call to provider office, please advise to call [facility]  at [address], [address], [place], [place] [address] Direct Dial: [phone] [email or web address] Website: [email or web address]

## 2021-06-09 ENCOUNTER — Telehealth: Payer: Medicaid Other

## 2021-06-10 ENCOUNTER — Telehealth: Payer: Self-pay | Admitting: Family Medicine

## 2021-06-10 NOTE — Telephone Encounter (Signed)
   Telephone encounter was:  Unsuccessful.  06/10/2021 Name: Sean Blake MRN: [id number] DOB: 08-08-1971  Unsuccessful outbound call made today to assist with:  Food Insecurity and housing.  Outreach Attempt:  2nd Attempt  A HIPAA compliant voice message was left requesting a return call.  Instructed patient to call back at [phone].  April Green Care Guide, Embedded Care Coordination [place], Care Management Phone: [phone] Email: [email or web address]

## 2021-06-14 ENCOUNTER — Telehealth: Payer: Self-pay | Admitting: Family Medicine

## 2021-06-14 NOTE — Telephone Encounter (Signed)
   Telephone encounter was:  Successful.  06/14/2021 Name: [name] MRN: [id number] DOB: [birth year]  Sean Blake is a 50 y.o. year old male who is a primary care patient of [name], DO . The community resource team was consulted for assistance with Food Insecurity and Financial Difficulties related to not working.  Care guide performed the following interventions: Follow up call placed to the patient to discuss status of referral. Patient received gift card and has been approved for Medicaid.  Follow Up Plan:  No further follow up planned at this time. The patient has been provided with needed resources.  April Green Care Guide, Embedded Care Coordination [place], Care Management Phone: [phone] Email: [email or web address]

## 2021-07-05 ENCOUNTER — Ambulatory Visit: Payer: Self-pay | Admitting: Licensed Clinical Social Worker

## 2021-07-05 ENCOUNTER — Ambulatory Visit: Payer: Self-pay

## 2021-07-05 ENCOUNTER — Telehealth: Payer: Medicaid Other | Admitting: General Practice

## 2021-07-05 DIAGNOSIS — M159 Polyosteoarthritis, unspecified: Secondary | ICD-10-CM

## 2021-07-05 DIAGNOSIS — R221 Localized swelling, mass and lump, neck: Secondary | ICD-10-CM

## 2021-07-05 DIAGNOSIS — F419 Anxiety disorder, unspecified: Secondary | ICD-10-CM

## 2021-07-05 DIAGNOSIS — R296 Repeated falls: Secondary | ICD-10-CM

## 2021-07-05 DIAGNOSIS — F32A Depression, unspecified: Secondary | ICD-10-CM

## 2021-07-05 DIAGNOSIS — F17200 Nicotine dependence, unspecified, uncomplicated: Secondary | ICD-10-CM

## 2021-07-05 DIAGNOSIS — F4321 Adjustment disorder with depressed mood: Secondary | ICD-10-CM

## 2021-07-05 DIAGNOSIS — I10 Essential (primary) hypertension: Secondary | ICD-10-CM

## 2021-07-05 DIAGNOSIS — M150 Primary generalized (osteo)arthritis: Secondary | ICD-10-CM

## 2021-07-05 NOTE — Patient Instructions (Signed)
Visit Information  Patient verbalizes understanding of instructions provided today and agrees to view in [place].   Telephone follow up appointment with care management team member scheduled for: 08-02-2021 at 230 pm  Noreene Larsson RN, MSN, [place] Mobile: [phone]

## 2021-07-05 NOTE — Chronic Care Management (AMB) (Signed)
Care Management    RN Visit Note  07/05/2021 Name: Sean Blake MRN: [id number] DOB: 1970-12-11  Subjective: Sean Blake is a 50 y.o. year old male who is a primary care patient of [name], DO. The care management team was consulted for assistance with disease management and care coordination needs.    Engaged with patient by telephone for follow up visit in response to provider referral for case management and/or care coordination services.   Consent to Services:   Sean Blake was given information about Care Management services today including:  Care Management services includes personalized support from designated clinical staff supervised by his physician, including individualized plan of care and coordination with other care providers 24/7 contact phone numbers for assistance for urgent and routine care needs. The patient may stop case management services at any time by phone call to the office staff.  Patient agreed to services and consent obtained.   Assessment: Review of patient past medical history, allergies, medications, health status, including review of consultants reports, laboratory and other test data, was performed as part of comprehensive evaluation and provision of chronic care management services.   SDOH (Social Determinants of Health) assessments and interventions performed:    Care Plan  Allergies  Allergen Reactions   Gabapentin Palpitations    Outpatient Encounter Medications as of 07/05/2021  Medication Sig Note   ibuprofen (ADVIL) 200 MG tablet Take 200 mg by mouth every 6 (six) hours as needed. 04/19/2021: Reports taking 2 tablets (400 mg) twice daily as needed   meloxicam (MOBIC) 15 MG tablet Take 1 tablet (15 mg total) by mouth daily as needed for pain (arthritis joint pain). (Patient not taking: Reported on 04/19/2021)    No facility-administered encounter medications on file as of 07/05/2021.    There are no problems to display  for this patient.   Conditions to be addressed/monitored: HTN, Anxiety, Depression, Osteoarthritis, Tobacco Use, and grief, homeless, frequent falls, and neck mass  Care Plan : RNCM: Adult General plan of care for neck mass, HTN, grief, anxiety, depression, osteoarthritis pain, frequent falls, homeless, and smoker  Updates made by Vanita Ingles, RN since 07/05/2021 12:00 AM     Problem: RNCM: Adult General plan of care for neck mass, HTN, grief, anxiety, depression, osteoarthritis pain, frequent falls, homeless, and smoker   Priority: High     Long-Range Goal: RNCM: Adult General plan of care for neck mass, HTN, grief, anxiety, depression, osteoarthritis pain, frequent falls, homeless, and smoker   Start Date: 04/15/2021  Expected End Date: 04/15/2022  Recent Progress: On track  Priority: High  Note:   Current Barriers:  Knowledge Deficits related to plan of care for management of HTN, Homelessness, Anxiety with Excessive Worry, neck mass, osteoarthritis, high risk for falls, and smokes  Social Anxiety,, Depression: depressed mood, insomnia, fatigue, anxiety, loss of energy/fatigue, disturbed sleep, weight gain,, Grief, Insomnia/Sleep Difficulties, Stress at the loss of his son and other deaths within a 6 month period of time , and PTSD, and limited resources due to no income   Care Coordination needs related to Financial constraints related to homelessness and inability to afford the basic necessities , Limited social support, Limited access to food, Housing barriers, Level of care concerns, Medication procurement, ADL IADL limitations, Mental Health Concerns , Substance abuse issues -  smoker, occasional pot smoker, occasional alcohol use, Social Isolation, Lack of essential utilities - homeless*, and Lacks knowledge of community resource: needs help with finances, medications  cost, finding a place to stay (sleeps in his truck a lot), medical care for neck mass likely malignant)    Chronic Disease Management support and education needs related to HTN, Homelessness, Anxiety with Excessive Worry, Social Anxiety,, Depression: depressed mood, insomnia, fatigue, anxiety, disturbed sleep, weight gain,, Grief, Insomnia/Sleep Difficulties, and Stress at loss of son and others within a 6 month period of time, and chronic pain, falls,  smoker, and neck mass Lacks caregiver support.  Film/video editor.  Difficulty obtaining medications Homeless and neck mass highly suspicious for malignancy   RNCM Clinical Goal(s):  Patient will verbalize understanding of plan for management of HTN, Anxiety, Depression, Homelessness, and grief, neck mass, osteoarthritis, and high risk for falls verbalize basic understanding of HTN, Anxiety, Depression, Homelessness, and neck mass, osteoarthritis, grief, and smoker disease process and self health management plan  take all medications exactly as prescribed and will call provider for medication related questions demonstrate understanding of rationale for each prescribed medication work with the pharm D on cost constraints  attend all scheduled medical appointments: saw pcp on 04-13-2021, will follow up again with pcp in about one month. 07-05-2021:  No upcoming appointments with the pcp  demonstrate improved and ongoing adherence to prescribed treatment plan for HTN, Anxiety, Depression, Homelessness, and neck mass, osteoarthritis, smoker, and fall risk as evidenced by adherence to prescribed medication regimen contacting provider for new or worsened symptoms or questions working with the CCM team to assist with resources to help with maintaining health and well being   demonstrate improved and ongoing health management independence getting the assistance he needs to help with his multiple chronic conditions and establish essentials to improve health and well being  continue to work with RN Care Manager to address care management and care  coordination needs related to HTN, Anxiety, Depression, Homelessness, and neck mass, osteoarthritis, grief, smoker, and frequent falls  work with pharmacist to address Financial constraints related to being able to afford basic necessities , Limited social support, Limited access to food, Housing barriers, Level of care concerns, Medication procurement, ADL IADL limitations, Mental Health Concerns , Substance abuse issues -  smoker, occasional smoker of pot, occasional alcohol use, and Social Isolation related to HTN, Anxiety, Depression, Homelessness, and neck mass, grief, smoker, osteoarthritis, and high falls risk work with Education officer, museum to address Financial constraints related to the need to obtain housing, and resources to pay for chronic disease management,medications, Limited social support, Limited access to food, Housing barriers, Level of care concerns, ADL IADL limitations, Mental Health Concerns , Substance abuse issues -  smoker, alcohol use and pot use , and Social Isolation related to the management of HTN, Anxiety, Depression, Homelessness, and grief, neck mass, osteoarthritis, falls, and smoker work with Data processing manager care guide to address needs related to Financial constraints related to financial needs for housing, food, and health care, Limited social support, Limited access to food, Housing barriers, and Level of care concerns  demonstrate a decrease in HTN, Anxiety, Depression, Homelessness, and grief, neck mass, osteoarthritis, falls and smoker  exacerbations  demonstrate ongoing self health care management ability by working with the CCM team to meet needs expressed through collaboration with Consulting civil engineer, provider, and care team.   Interventions: 1:1 collaboration with primary care provider regarding development and update of comprehensive plan of care as evidenced by provider attestation and co-signature Inter-disciplinary care team collaboration (see longitudinal plan of  care) Evaluation of current treatment plan related to chronic disease management and  self management and patient's adherence to plan as established by provider   SDOH Barriers (Status: Goal on track: NO.)  Patient interviewed and SDOH assessment performed        SDOH Interventions    Flowsheet Row Most Recent Value  SDOH Interventions   Housing Interventions Other (Comment)  [sleeps in his truck]  Physical Activity Interventions Other (Comments)  [limited mobility- cannot tolerated standing more than 10-15 mins]  Social Connections Interventions Other (Comment)  [has children and friends, good support system]  Transportation Interventions Other (Comment)  [sometimes sleeps in his truck]     Patient interviewed and appropriate assessments performed Referred patient to community resources care guide team for assistance with housing needs, food resources and community resources to assist with SDOH and basic needs. 04-22-2021: The patient is actively working with the CCM team and care guides to get what he needs to obtain medication, food resources, housing, and health care. [facility]. Turned in application yesterday for the housing authority. 05-27-2021: Continues to work on paperwork and working with the CCM team. Is making progress. 07-05-2021: The patient remains homeless and is staying with friends but has to move in 2 weeks as they have ask him to be gone before the holidays get here. The patient states he has nowhere to go. Collaboration with the LCSW and a new care guide referral placed. Education and support given to the patient who was tearful during the call. Has been told that the paperwork he submitted is not acceptable and he needs to fill out new paperwork. Empathetic listening.  Provided mental health counseling with regard to grief, depression and anxiety (mental health diagnosis or concern).  07-05-2021: the patient  requested help with getting psychiatric assistance. Denies suicidal ideation but feels nothing is going right for him and he can't catch a break. Collaborated with the LCSW and scheduler to get the patient a follow up with LCSW. The LCSW is going to work on reaching out to the patient today to assist with needs. Provided patient with information about griefshare.org and sent information by email to the patient with CAFA. Instructed the patient to fill out and get needed documents for application to see if he could be approved for assistance in the [facility] system for multiple chronic conditions. 04-22-2021: Has filled out CAFA form and sent form back in to the email listed on the form. 07-05-2021: States paperwork not filled out correctly and a new packet was sent to him. Transportation barrier also. New referral for care guides generated. Discussed plans with patient for ongoing care management follow up and provided patient with direct contact information for care management team Advised patient to look for email with CAFA application, look for email with griefshare information, expect calls from the pharm D, the LCSW, the care guides, and ongoing support from the [facility], also advised the patient to call for questions, help, or concerns with filing out paperwork for CAFA.  Collaborated with RN Case Manager re: SDOH needs, Chronic disease management and care coordination needs  Collaborated with care guides  (community agency) re: housing, food resources, cost constraints, no income coming in at this time.  Assisted patient/caregiver with obtaining information about health plan benefits Provided education to patient/caregiver regarding level of care options. Referred patient to LCSW for long term follow up and therapy/counseling in relation to anxiety, depression, grief, and substance use. 07-05-2021: Secure chat with the LCSW for  follow up with the patient ASAP     Falls:  (Status: Goal on track:  YES.) Provided written and verbal education re: potential causes of falls and Fall prevention strategies Reviewed medications and discussed potential side effects of medications such as dizziness and frequent urination Advised patient of importance of notifying provider of falls Assessed for signs and symptoms of orthostatic hypotension. 05-27-2021: Review of hypotension. The patient was having hypotension at the time of his event on 05-19-2021 and being evaluated in the ER Assessed for falls since last encounter. 04-22-2021: Denies any new falls at this time. Will continue to monitor for changes. 05-27-2021: The patient had a near fall experience on 05-19-2021 when he got dehydrated and too hot. He states that he was evaluated in the ER. Got fluid resuscitation and discharged home. Is feeling better. Review of safety and staying hydrated. 07-05-2021: Denies any new falls at this time. Assessed patients knowledge of fall risk prevention secondary to previously provided education  Grief, depression, and anxiety   (Status: Goal on track: NO.) Evaluation of current treatment plan related to Anxiety, Depression, Homelessness, and grief , Financial constraints related to inability to afford health care, basic needs and medicaitons , Limited social support, Limited access to food, Housing barriers, Level of care concerns, ADL IADL limitations, Mental Health Concerns , Substance abuse issues -  smoker, alcohol use and pot use, and Social Isolation self-management and patient's adherence to plan as established by provider. 04-22-2021: Has filled out several applications for assistance with medications, housing, healthcare affordability and food resources. [facility] at the time of the call. Will continue to monitor for changes. 05-27-2021: Is getting paperwork processed and working on applications. Is working with the LCSW for help with effective management of mental health  and well being. 07-05-2021: The patient is very emotional and tearful. States that he still needs to find a place to live and he is not having any improvement on his health. Feels he needs a psychiatrist. Secure messaging with the LCSW for support and help with expressed needs. The patient having issues with transportation, filling out paperwork and getting basic needs. Collaboration with the team.  Discussed plans with patient for ongoing care management follow up and provided patient with direct contact information for care management team Advised patient to fill out CAFA application and follow up with the SW through the Medicaid office to help with obtaining updated information and apply for full Medicaid. The patient only has family planning Medicaid; Provided education to patient re: griefshare.org information and local groups to help with his grief over the loss of his son and other family and friends over a 6 month period of time. ; Collaborated with LCSW regarding CAFA application and the RNCM being able to reach out sooner to the patient to assist with the needs expressed with pcp on 04-13-2021, follow up with the LCSW on 04-27-2021. 05-27-2021: Ongoing support and education with the LCSW and CCM team. 07-05-2021: Reached out to the LCSW by secure chat with urgent request to reach out to the patient for assistnance with expressed needs.  Provided patient and/or caregiver with housing, food resources, community resources information about care guides calling him for assistance with resources for [employer] 05-27-2021: Is currently working with the care guides for housing information, food resources, and other community resources. 07-05-2021: Still needs housing. Collaboration with the LCSW and care guides for assistance with expressed needs.  Provided patient with grief support and CAFA educational materials related to grief and loss and financial assistance through [facility]; [place] referral for housing, food resources, and other resources to help the patient in [place]. 05-27-2021: Is currently working with the care guides for needed resources.  Social Work referral for program assistance, mental health counseling and support. Appointment with the LCSW on 04-27-2021. 07-05-2021: Actively working with the LCSW at this time.  Discussed plans with patient for ongoing care management follow up and provided patient with direct contact information for care management team; Screening for signs and symptoms of depression related to chronic disease state;  Assessed social determinant of health barriers;   Health Maintenance (Status: Goal on track: YES.)  Patient interviewed about adult health maintenance status including Depression screen    Falls risk assessment    Blood Pressure      Advised patient to discuss Depression screen    Falls risk assessment    Blood Pressure    with primary care provider       Hypertension: (Status: Goal on track: YES.) Last practice recorded BP readings:  BP Readings from Last 3 Encounters:  05/19/21 126/73  04/13/21 (!) 149/97  05/29/18 (!) 141/87  Most recent eGFR/CrCl: No results found for: EGFR  No components found for: CRCL  Evaluation of current treatment plan related to hypertension self management and patient's adherence to plan as established by provider.  05-27-2021: The patient denies any issues with HTN at this time. The patient is working on establishing permanent housing and getting help with needed health care cost. Seen in the ER recently for dehydration with hypotension. Is feeling better now;   Provided education to patient re: stroke prevention, s/s of heart attack and stroke. 05-27-2021: Review of uncontrolled blood pressure and the ill effects on body systems. Reviewed prescribed diet heart healthy diet. 07-05-2021: Adheres to heart healthy diet Reviewed medications with patient and discussed  importance of compliance. [id number]: States compliance with medications, working with pharm D;  Counseled on adverse effects of illicit drug and excessive alcohol use in patients with high blood pressure;  Discussed plans with patient for ongoing care management follow up and provided patient with direct contact information for care management team; Provided education on prescribed diet heart healthy diet, will send information by EMMI and my chart;  Discussed complications of poorly controlled blood pressure such as heart disease, stroke, circulatory complications, vision complications, kidney impairment, sexual dysfunction;   Pain:  (Status: Goal on track: YES.) Pain assessment performed- patient with osteoarthritis limiting him from doing his usual work at [employer] Medications reviewed- only takes Meloxicam 05-27-2021: Working with the [facility] to get Meloxicam. States Meloxicam helps with his pain management. 07-05-2021: The patient has Meloxicam. He is taking as prescribed. Rates his pain at a 6 at the time of the call. Reviewed provider established plan for pain management; Discussed importance of adherence to all scheduled medical appointments; Counseled on the importance of reporting any/all new or changed pain symptoms or management strategies to pain management provider; Advised patient to report to care team affect of pain on daily activities; Discussed use of relaxation techniques and/or diversional activities to assist with pain reduction (distraction, imagery, relaxation, massage, acupressure, TENS, heat, and cold application; Reviewed with patient prescribed pharmacological and nonpharmacological pain relief strategies; Pharm D consult to see if the patient can obtain Meloxicam [facility]    Neck mass  (Status: Goal on track: NO.) Evaluation of  current treatment plan related to  neck mass , Financial constraints related to no health insurance  and Level of  care concerns self-management and patient's adherence to plan as established by provider.07-05-2021: The patient states that he sometimes thinks the mass is larger and "tight", sometimes it does not feel as large or tight. Still has been unable to see a specialist due to no insurance and cost constraints. Education and support given. Discussed plans with patient for ongoing care management follow up and provided patient with direct contact information for care management team Advised patient to file out application for CAFA, the patient has no insurance and the neck mass is highly suspicious for malignancy and needs to be treated. The CCM team is working with the patient to get health care and treatment for chronic conditions. 05-27-2021: The patient is waiting to hear back from completed applications. [facility]. 07-05-2021: Paperwork was not filled out correctly. They have sent him a new packet. The patient states he is unsure how to fill it out appropriately. Have reached out to the LCSW for assistance.  Provided education to patient re: CCM team and helping paitent achieve his health and wellness goals ; Collaborated with CCM team and pcp  regarding financial constraints and resources provided for the patient to obtain needed care for neck mass and other health related needs. ; Discussed plans with patient for ongoing care management follow up and provided patient with direct contact information for care management team;  Smoking Cessation: (Status: New goal. Goal on track: NO.) Reviewed smoking history:  tobacco abuse of 40 plus years; currently smoking 0.5 ppd Previous quit attempts, unsuccessful not any successful using has never tried   Reports smoking within 30 minutes of waking up Reports triggers to smoke include: anxiety, grief, depression, life stressors Reports motivation to quit smoking includes: not interested at this time On a scale of 1-10,  reports MOTIVATION to quit is not interested at this time On a scale of 1-10, reports CONFIDENCE in quitting is Not interested at this time. Reviewed with the patient there are ways to help with smoking cessation when he is ready  Evaluation of current treatment plan reviewed; Advised patient to discuss smoking cessation options with provider; Provided contact information for [place] [facility] (1-800-QUIT-NOW); Provided patient with printed smoking cessation educational materials; Discussed plans with patient for ongoing care management follow up and provided patient with direct contact information for care management team;  Patient Goals/Self-Care Activities: Patient will self administer medications as prescribed Patient will attend all scheduled provider appointments Patient will call pharmacy for medication refills Patient will attend church or other social activities Patient will continue to perform ADL's independently Patient will continue to perform IADL's independently Patient will call provider office for new concerns or questions Patient will work with BSW to address care coordination needs and will continue to work with the clinical team to address health care and disease management related needs.   Patient will obtain needed resources for SDOH to assist with maintaining health and well being       Plan: Telephone follow up appointment with care management team member scheduled for:  08-02-2021 and 230 pm  May[place], MSN, [place] Mobile: [phone]

## 2021-07-07 NOTE — Patient Instructions (Addendum)
Visit Information  Patient Goals/Self-Care Activities: Over the next 120 days Participation in mental health treatment encouraged Strategies to manage emotional triggers promoted Practice positive thinking and self-talk Follow up on insurance options (CAFA) Contact office with any questions or concerns Attend all scheduled appointments with providers  Patient verbalizes understanding of instructions provided today and agrees to view in [place].   Telephone follow up appointment with care management team member scheduled for:07/14/21  [name], MSW, [email or web address] Phone [phone] 11:49 AM

## 2021-07-07 NOTE — Chronic Care Management (AMB) (Signed)
Care Management Clinical Social Work Note  07/07/2021 Name: Sean Blake MRN: [id number] DOB: 05-Mar-1971  Sean Blake is a 50 y.o. year old male who is a primary care patient of [name], DO.  The Care Management team was consulted for assistance with chronic disease management and coordination needs.  Engaged with patient by telephone for follow up visit in response to provider referral for social work chronic care management and care coordination services  Consent to Services:  [name] was given information about Care Management services today including:  Care Management services includes personalized support from designated clinical staff supervised by his physician, including individualized plan of care and coordination with other care providers 24/7 contact phone numbers for assistance for urgent and routine care needs. The patient may stop case management services at any time by phone call to the office staff.  Patient agreed to services and consent obtained.   Consent to Services:  The patient was given information about Care Management services, agreed to services, and gave verbal consent prior to initiation of services.  Please see initial visit note for detailed documentation.   Patient agreed to services today and consent obtained.  Engaged with patient by phone in response to provider referral for social work care coordination services:  Assessment/Interventions:  Patient continues to experience difficulty with management of anxiety symptoms and obtaining independent housing. CCM LCSW discussed emergency housing options, in addition, to local agencies that can assist with medication management.  See Care Plan below for interventions and patient self-care activities.  Recent life changes or stressors: Housing and management of anxiety symptoms  Recommendation: Patient may benefit from, and is in agreement work with LCSW to address care coordination needs  and will continue to work with the clinical team to address health care and disease management related needs.   Follow up Plan: Patient would like continued follow-up from CCM LCSW .  per patient's request will follow up in 07/14/21.  Will call office if needed prior to next encounter.  SDOH (Social Determinants of Health) assessments and interventions performed:    Advanced Directives Status: Not addressed in this encounter.  Care Plan  Allergies  Allergen Reactions   Gabapentin Palpitations    Outpatient Encounter Medications as of 07/05/2021  Medication Sig Note   ibuprofen (ADVIL) 200 MG tablet Take 200 mg by mouth every 6 (six) hours as needed. 04/19/2021: Reports taking 2 tablets (400 mg) twice daily as needed   meloxicam (MOBIC) 15 MG tablet Take 1 tablet (15 mg total) by mouth daily as needed for pain (arthritis joint pain). (Patient not taking: Reported on 04/19/2021)    No facility-administered encounter medications on file as of 07/05/2021.    There are no problems to display for this patient.   Conditions to be addressed/monitored: Anxiety and Depression; Financial constraints related to medications, Limited social support, Transportation, Housing barriers, and Mental Health Concerns   Care Plan : LCSW Plan of Care  Updates made by [name], LCSW since 07/07/2021 12:00 AM     Problem: Barriers to Treatment      Goal: Barriers to Treatment Identified and Managed   Start Date: 04/27/2021  This Visit's Progress: On track  Recent Progress: On track  Priority: High  Note:   Current barriers:   Financial strain, Limited social support, Housing barriers, and Mental Health Concerns  Needs Support, Education, and Care Coordination in order to meet unmet mental health needs. Clinical Goal(s): demonstrate a reduction in  symptoms related to :Anxiety  and Depression  explore community resource options for unmet needs related ZO:[id number] constraints and No health  insurance    Clinical Interventions:  Assessed patient's previous and current treatment, coping skills, support system and barriers to care  Patient reports stress triggered by upcoming court date scheduled 05/26/21 regarding a case from a couple of years ago. Patient agreed to contact the DA to request assistance in dismissing the charges. Patient reports that he is working on a way to travel to [facility] where the case is pending Patient reports that he is interested in establishing counseling to address symptoms of depression and anxiety triggered by extensive trauma and grief. CCM LCSW discussed local agencies that provide counseling and medication management. Resources will be provided via e-mail for future reference 09/27: Patient prefers to obtain psychiatric and/or counseling through [facility]. Prefers to complete CAFA application and have PCP complete referral to [facility] 11/7: Patient reports feelings of anxiety. He has been residing with a friend; however, they need him to leave in the next two weeks. Patient reports difficulty managing feelings of sadness, irritability, and being around others "I go into a cold sweat" Denies SI/HI Patient has provided disability CM consent to review medical bills. He has not heard from them about the appeal. Patient currently does not have any income.CCM LCSW provided patient with local emergency shelters. Patient reports that he is unable to being around people Patient reports difficulty affording his medications. CCM LCSW provided information on [facility] Patient will submit application to [facility] when state I.D. arrives 09/13: Patient reports that his state ID came in the mail and he will submit application this week 98/11: Patient reports that he has been without a vehicle for approximately one week. During this time, he has been unable to submit application to [facility] or drop off CAFA to PCP  office. Patient agreed to mail application and supportive documentation to PCP office within this week 11/7: Patient agreed to submit application on 91/47/82 Patient shared that he is feeling better after recent ED visit. He currently has money to pick up his meds. CCM LCSW informed him that they are still available and verified patient's pharmacy CCM LCSW reviewed upcoming appointments Patient reports that he received CAFA application via e-mail 95/62: CCM LCSW mailed a print out of CAFA application, per patient request. CCM LCSW reviewed required documents associated with application Patient has completed intake for Disability Claim. States he has not heard back from [place]. Patient agreed to obtain contact information and provide to CCM LCSW to strengthen support regarding next steps CCM LCSW discussed benefits of obtaining assistance from Legal Aid to assist with renewal of SNAP benefits. LCSW will complete Legal Aid Referral CCM LCSW collaborated with [facility] regarding plan of care for patient. RNCM has completed a Care Guide referral to assist with housing/financial strain Review various resources, discussed options and provided patient information about Various insurance options ( Orange Card, Financial planner Care Act) Depression screen reviewed , Solution-Focused Strategies, Mindfulness or Psychologist, educational, Active listening / Reflection utilized , Emotional Supportive Provided, Provided psychoeducation for mental health needs , Verbalization of feelings encouraged , and Research officer, political party / information provided   1:1 collaboration with primary care provider regarding development and update of comprehensive plan of care as evidenced by provider attestation and co-signature Inter-disciplinary care team collaboration (see longitudinal plan of care) Patient Goals/Self-Care Activities: Over the next 120  days Participation in mental health treatment  encouraged Strategies to manage emotional triggers promoted Practice positive thinking and self-talk Follow up on insurance options (CAFA) Contact office with any questions or concerns Attend all scheduled appointments with providers      [name], MSW, [email or web address] Phone [phone] 11:44 AM

## 2021-07-08 ENCOUNTER — Telehealth: Payer: Self-pay

## 2021-07-08 ENCOUNTER — Telehealth: Payer: Medicaid Other

## 2021-07-08 NOTE — Telephone Encounter (Signed)
   Telephone encounter was:  Successful.  07/08/2021 Name: [name] MRN: [id number] DOB: 1970/11/09  Sean Blake is a 50 y.o. year old male who is a primary care patient of [name], DO . The community resource team was consulted for assistance with Transportation Needs  and housing  Care guide performed the following interventions: Spoke with patient about housing resources. [employer] is no longer taking applications no waitlist, [employer] is taking applications.  Confirmed email address sent housing authority information and [employer] for [facility].  Follow Up Plan:  Care guide will follow up with patient by phone over the next 7-10 days.  Maelie Chriswell, AAS Paralegal, [place] Management  [address],  [address] ??[email or web address]  ?? [phone]   [email or web address]

## 2021-07-14 ENCOUNTER — Ambulatory Visit: Payer: Medicaid Other | Admitting: Licensed Clinical Social Worker

## 2021-07-19 NOTE — Chronic Care Management (AMB) (Signed)
    Clinical Social Work  Care Management   Phone Outreach    07/19/2021 Name: Sean Blake MRN: [id number] DOB: 04/12/1971  Sean Blake is a 50 y.o. year old male who is a primary care patient of [place] Ranger, Devonne Doughty, DO .   Reason for referral: [employer]  and [place] and Resources.    F/U phone call today to assess needs, progress and barriers with care plan goals.   Unable to keep phone appointment today and requested to reschedule.  Plan:Appointment was rescheduled with CCM LCSW for 07/21/21  Review of patient status, including review of consultants reports, relevant laboratory and other test results, and collaboration with appropriate care team members and the patient's provider was performed as part of comprehensive patient evaluation and provision of care management services.    [name], MSW, [facility] Care Management [email or web address] Phone [phone] 4:18 AM

## 2021-07-20 ENCOUNTER — Telehealth: Payer: Self-pay

## 2021-07-20 NOTE — Telephone Encounter (Signed)
   Telephone encounter was:  Unsuccessful.  07/20/2021 Name: Sean Blake MRN: [id number] DOB: [birth year]  Unsuccessful outbound call made today to assist with:   housing .  Left messsage on voicemail to follow-up about emailed resources for housing sent on 11/10.  Outreach Attempt:  2nd Attempt  A HIPAA compliant voice message was left requesting a return call.  Instructed patient to call back at [phone].  Roc Streett, AAS Paralegal, [place] Management  [address], [place] [address] ??[email or web address]  ?? [phone]   [email or web address]

## 2021-07-21 ENCOUNTER — Telehealth: Payer: Medicaid Other

## 2021-07-24 DIAGNOSIS — Z990 Dependence on aspirator: Secondary | ICD-10-CM | POA: Diagnosis not present

## 2021-07-24 DIAGNOSIS — R221 Localized swelling, mass and lump, neck: Secondary | ICD-10-CM | POA: Diagnosis not present

## 2021-07-25 DIAGNOSIS — R221 Localized swelling, mass and lump, neck: Secondary | ICD-10-CM | POA: Diagnosis not present

## 2021-07-26 ENCOUNTER — Telehealth: Payer: Self-pay | Admitting: Licensed Clinical Social Worker

## 2021-07-26 NOTE — Telephone Encounter (Signed)
    Clinical Social Work  Care Management   Phone Outreach    07/26/2021 Name: CONN TROMBETTA MRN: [id number] DOB: 20-Dec-1970  Orlan Leavens Baldinger is a 50 y.o. year old male who is a primary care patient of [name], DO .   Reason for referral: [place] and Resources.    F/U phone call today to assess needs, progress and barriers with care plan goals.   Telephone outreach was unsuccessful. A HIPPA compliant phone message was left for the patient providing contact information and requesting a return call.   Plan:CCM LCSW will wait for return call. If no return call is received, Will reach out to patient again in the next 14 days .   Review of patient status, including review of consultants reports, relevant laboratory and other test results, and collaboration with appropriate care team members and the patient's provider was performed as part of comprehensive patient evaluation and provision of care management services.    [name], MSW, [facility] Care Management [email or web address] Phone [phone] 7:16 AM

## 2021-07-27 ENCOUNTER — Telehealth: Payer: Self-pay

## 2021-07-27 NOTE — Telephone Encounter (Signed)
   Telephone encounter was:  Successful.  07/27/2021 Name: [name] MRN: [id number] DOB: 01-24-1971  Sean Blake is a 50 y.o. year old male who is a primary care patient of [name], DO . The community resource team was consulted for assistance with Transportation Needs  and housing  Care guide performed the following interventions: Spoke with patient he has received emailed resources and is completing applications for housing. He is currently staying with friends. He is also applying for financial assistance.   Follow Up Plan:  No further follow up planned at this time. The patient has been provided with needed resources.  Trissa Molina, AAS Paralegal, [place] Management  [address], [place] [address] ??[email or web address]  ?? [phone]   [email or web address]

## 2021-08-02 ENCOUNTER — Ambulatory Visit: Payer: Self-pay

## 2021-08-02 ENCOUNTER — Telehealth: Payer: Medicaid Other

## 2021-08-02 DIAGNOSIS — F4321 Adjustment disorder with depressed mood: Secondary | ICD-10-CM

## 2021-08-02 DIAGNOSIS — M159 Polyosteoarthritis, unspecified: Secondary | ICD-10-CM

## 2021-08-02 DIAGNOSIS — F32A Depression, unspecified: Secondary | ICD-10-CM

## 2021-08-02 DIAGNOSIS — F17200 Nicotine dependence, unspecified, uncomplicated: Secondary | ICD-10-CM

## 2021-08-02 DIAGNOSIS — R221 Localized swelling, mass and lump, neck: Secondary | ICD-10-CM

## 2021-08-02 DIAGNOSIS — F419 Anxiety disorder, unspecified: Secondary | ICD-10-CM

## 2021-08-02 DIAGNOSIS — I10 Essential (primary) hypertension: Secondary | ICD-10-CM

## 2021-08-02 DIAGNOSIS — R296 Repeated falls: Secondary | ICD-10-CM

## 2021-08-02 NOTE — Patient Instructions (Signed)
Visit Information  Thank you for taking time to visit with me today. Please don't hesitate to contact me if I can be of assistance to you before our next scheduled telephone appointment.  Following are the goals we discussed today:  RNCM Clinical Goal(s):  Patient will verbalize understanding of plan for management of HTN, Anxiety, Depression, Homelessness, and grief, neck mass, osteoarthritis, and high risk for falls verbalize basic understanding of HTN, Anxiety, Depression, Homelessness, and neck mass, osteoarthritis, grief, and smoker disease process and self health management plan  take all medications exactly as prescribed and will call provider for medication related questions demonstrate understanding of rationale for each prescribed medication work with the pharm D on cost constraints  attend all scheduled medical appointments: saw pcp on 04-13-2021, will follow up again with pcp in about one month. 08-02-2021:  No upcoming appointments with the pcp  demonstrate improved and ongoing adherence to prescribed treatment plan for HTN, Anxiety, Depression, Homelessness, and neck mass, osteoarthritis, smoker, and fall risk as evidenced by adherence to prescribed medication regimen contacting provider for new or worsened symptoms or questions working with the CCM team to assist with resources to help with maintaining health and well being   demonstrate improved and ongoing health management independence getting the assistance he needs to help with his multiple chronic conditions and establish essentials to improve health and well being  continue to work with RN Care Manager to address care management and care coordination needs related to HTN, Anxiety, Depression, Homelessness, and neck mass, osteoarthritis, grief, smoker, and frequent falls  work with pharmacist to address Financial constraints related to being able to afford basic necessities , Limited social support, Limited access to food, Housing  barriers, Level of care concerns, Medication procurement, ADL IADL limitations, Mental Health Concerns , Substance abuse issues -  smoker, occasional smoker of pot, occasional alcohol use, and Social Isolation related to HTN, Anxiety, Depression, Homelessness, and neck mass, grief, smoker, osteoarthritis, and high falls risk work with Education officer, museum to address Financial constraints related to the need to obtain housing, and resources to pay for chronic disease management,medications, Limited social support, Limited access to food, Housing barriers, Level of care concerns, ADL IADL limitations, Mental Health Concerns , Substance abuse issues -  smoker, alcohol use and pot use , and Social Isolation related to the management of HTN, Anxiety, Depression, Homelessness, and grief, neck mass, osteoarthritis, falls, and smoker work with Data processing manager care guide to address needs related to Financial constraints related to financial needs for housing, food, and health care, Limited social support, Limited access to food, Housing barriers, and Level of care concerns  demonstrate a decrease in HTN, Anxiety, Depression, Homelessness, and grief, neck mass, osteoarthritis, falls and smoker  exacerbations  demonstrate ongoing self health care management ability by working with the CCM team to meet needs expressed through collaboration with Consulting civil engineer, provider, and care team.    Interventions: 1:1 collaboration with primary care provider regarding development and update of comprehensive plan of care as evidenced by provider attestation and co-signature Inter-disciplinary care team collaboration (see longitudinal plan of care) Evaluation of current treatment plan related to chronic disease management and  self management and patient's adherence to plan as established by provider     SDOH Barriers (Status: Goal on track: NO.)  Patient interviewed and SDOH assessment performed        SDOH Interventions      Flowsheet Row Most Recent Value  SDOH Interventions  Housing Interventions Other (Comment)  [sleeps in his truck]  Physical Activity Interventions Other (Comments)  [limited mobility- cannot tolerated standing more than 10-15 mins]  Social Connections Interventions Other (Comment)  [has children and friends, good support system]  Transportation Interventions Other (Comment)  [sometimes sleeps in his truck]       Patient interviewed and appropriate assessments performed Referred patient to community resources care guide team for assistance with housing needs, food resources and community resources to assist with SDOH and basic needs. 04-22-2021: The patient is actively working with the CCM team and care guides to get what he needs to obtain medication, food resources, housing, and health care. [facility]. Turned in application yesterday for the housing authority. 05-27-2021: Continues to work on paperwork and working with the CCM team. Is making progress. 07-05-2021: The patient remains homeless and is staying with friends but has to move in 2 weeks as they have ask him to be gone before the holidays get here. The patient states he has nowhere to go. Collaboration with the LCSW and a new care guide referral placed. Education and support given to the patient who was tearful during the call. Has been told that the paperwork he submitted is not acceptable and he needs to fill out new paperwork. Empathetic listening.  Provided mental health counseling with regard to grief, depression and anxiety (mental health diagnosis or concern).  07-05-2021: the patient requested help with getting psychiatric assistance. Denies suicidal ideation but feels nothing is going right for him and he can't catch a break. Collaborated with the LCSW and scheduler to get the patient a follow up with LCSW. The LCSW is going to work on reaching out to the patient  today to assist with needs. Provided patient with information about griefshare.org and sent information by email to the patient with CAFA. Instructed the patient to fill out and get needed documents for application to see if he could be approved for assistance in the [facility] system for multiple chronic conditions. 04-22-2021: Has filled out CAFA form and sent form back in to the email listed on the form. 07-05-2021: States paperwork not filled out correctly and a new packet was sent to him. Transportation barrier also. New referral for care guides generated. Discussed plans with patient for ongoing care management follow up and provided patient with direct contact information for care management team Advised patient to look for email with CAFA application, look for email with griefshare information, expect calls from the pharm D, the LCSW, the care guides, and ongoing support from the [facility], also advised the patient to call for questions, help, or concerns with filing out paperwork for CAFA.  Collaborated with RN Case Manager re: SDOH needs, Chronic disease management and care coordination needs  Collaborated with care guides  (community agency) re: housing, food resources, cost constraints, no income coming in at this time.  Assisted patient/caregiver with obtaining information about health plan benefits Provided education to patient/caregiver regarding level of care options. Referred patient to LCSW for long term follow up and therapy/counseling in relation to anxiety, depression, grief, and substance use. 07-05-2021: Secure chat with the LCSW for follow up with the patient ASAP  08-02-2021: The patient remains homeless, but is staying with his step-daughter and ex-wife. He says this is short term. Is finishing paperwork up and also working with disability. Has upcoming appointment in January for disability. Is working with housing authority  to get help with housing.        Falls:  (Status: Goal on  track: YES.) Provided written and verbal education re: potential causes of falls and Fall prevention strategies Reviewed medications and discussed potential side effects of medications such as dizziness and frequent urination Advised patient of importance of notifying provider of falls Assessed for signs and symptoms of orthostatic hypotension. 05-27-2021: Review of hypotension. The patient was having hypotension at the time of his event on 05-19-2021 and being evaluated in the ER. 08-02-2021: No issues with orthostatic hypotension at this time. Assessed for falls since last encounter. 04-22-2021: Denies any new falls at this time. Will continue to monitor for changes. 05-27-2021: The patient had a near fall experience on 05-19-2021 when he got dehydrated and too hot. He states that he was evaluated in the ER. Got fluid resuscitation and discharged home. Is feeling better. Review of safety and staying hydrated. 08-02-2021: Denies any new falls at this time. Assessed patients knowledge of fall risk prevention secondary to previously provided education   Grief, depression, and anxiety   (Status: Goal on Track (progressing): YES.) Evaluation of current treatment plan related to Anxiety, Depression, Homelessness, and grief , Financial constraints related to inability to afford health care, basic needs and medicaitons , Limited social support, Limited access to food, Housing barriers, Level of care concerns, ADL IADL limitations, Mental Health Concerns , Substance abuse issues -  smoker, alcohol use and pot use, and Social Isolation self-management and patient's adherence to plan as established by provider. 04-22-2021: Has filled out several applications for assistance with medications, housing, healthcare affordability and food resources. [facility] time of the call. Will continue to monitor for changes. 05-27-2021: Is getting paperwork processed and working on  applications. Is working with the LCSW for help with effective management of mental health and well being. 07-05-2021: The patient is very emotional and tearful. States that he still needs to find a place to live and he is not having any improvement on his health. Feels he needs a psychiatrist. Secure messaging with the LCSW for support and help with expressed needs. The patient having issues with transportation, filling out paperwork and getting basic needs. Collaboration with the team. 08-02-2021: The patient is in better spirits today and says he is not in a "funk" like he had been. He is making progress. He did go to [facility] ER and thought that he would be able to get some help with his medical issues but he did not. He was very disappointed with the service there. He is finishing his paperwork with the help of his daughter and has appointments set up for disability and food stamps.  Discussed plans with patient for ongoing care management follow up and provided patient with direct contact information for care management team Advised patient to fill out CAFA application and follow up with the SW through the Medicaid office to help with obtaining updated information and apply for full Medicaid. The patient only has family planning Medicaid; Provided education to patient re: griefshare.org information and local groups to help with his grief over the loss of his son and other family and friends over a 6 month period of time. ; Collaborated with LCSW regarding CAFA application and the RNCM being able to reach out sooner to the patient to assist with the needs expressed with pcp on 04-13-2021, follow up with the LCSW on 04-27-2021. 05-27-2021: Ongoing support  and education with the LCSW and CCM team. 07-05-2021: Reached out to the LCSW by secure chat with urgent request to reach out to the patient for assistnance with expressed needs. 08-02-2021: Is working with the LCSW for help with housing and paperwork needs.   Provided patient and/or caregiver with housing, food resources, community resources information about care guides calling him for assistance with resources for [employer] 05-27-2021: Is currently working with the care guides for housing information, food resources, and other community resources. 07-05-2021: Still needs housing. Collaboration with the LCSW and care guides for assistance with expressed needs. 08-02-2021: Is working with the LCSW for ongoing education and support. Provided patient with grief support and CAFA educational materials related to grief and loss and financial assistance through [facility]; [place] referral for housing, food resources, and other resources to help the patient in [place]. 05-27-2021: Is currently working with the care guides for needed resources. 08-02-2021: Completed.  Social Work referral for program assistance, mental health counseling and support. Appointment with the LCSW on 04-27-2021. 08-02-2021: Actively working with the LCSW at this time.  Discussed plans with patient for ongoing care management follow up and provided patient with direct contact information for care management team; Screening for signs and symptoms of depression related to chronic disease state;  Assessed social determinant of health barriers;    Health Maintenance (Status: Goal on track: YES.)  Patient interviewed about adult health maintenance status including Depression screen    Falls risk assessment    Blood Pressure      Advised patient to discuss Depression screen    Falls risk assessment    Blood Pressure    with primary care provider            Hypertension: (Status: Goal on track: YES.) Last practice recorded BP readings:     BP Readings from Last 3 Encounters:  05/19/21 126/73  04/13/21 (!) 149/97  05/29/18 (!) 141/87  Most recent eGFR/CrCl: No results found for: EGFR  No components found for: CRCL   Evaluation of current  treatment plan related to hypertension self management and patient's adherence to plan as established by provider.  05-27-2021: The patient denies any issues with HTN at this time. The patient is working on establishing permanent housing and getting help with needed health care cost. Seen in the ER recently for dehydration with hypotension. Is feeling better now. 08-02-2021: Feels his blood pressure is stable. No new issues at this time with heart health;   Provided education to patient re: stroke prevention, s/s of heart attack and stroke. 08-02-2021: Review of uncontrolled blood pressure and the ill effects on body systems. Reviewed prescribed diet heart healthy diet. 08-02-2021: Adheres to heart healthy diet Reviewed medications with patient and discussed importance of compliance. 08-02-2021: States compliance with medications, working with pharm D;  Counseled on adverse effects of illicit drug and excessive alcohol use in patients with high blood pressure;  Discussed plans with patient for ongoing care management follow up and provided patient with direct contact information for care management team; Provided education on prescribed diet heart healthy diet, will send information by EMMI and my chart;  Discussed complications of poorly controlled blood pressure such as heart disease, stroke, circulatory complications, vision complications, kidney impairment, sexual dysfunction;    Pain:  (Status: Goal on track: YES.) Pain assessment performed- patient with osteoarthritis limiting him from doing his usual work at [employer] Medications reviewed- only takes Meloxicam 05-27-2021: Working with the  [facility] to get Meloxicam. States Meloxicam helps with his pain management. 07-05-2021: The patient has Meloxicam. He is taking as prescribed. Rates his pain at a 6 at the time of the call. 08-02-2021: The patient states pain level is at a 6 today on a scale of 0-10. The patient denies any acute distress. Takes medications  as prescribed. Wants to get financial assistance so he can manage his health and well being better. Working with the CCM team.  Reviewed provider established plan for pain management; Discussed importance of adherence to all scheduled medical appointments; Counseled on the importance of reporting any/all new or changed pain symptoms or management strategies to pain management provider; Advised patient to report to care team affect of pain on daily activities; Discussed use of relaxation techniques and/or diversional activities to assist with pain reduction (distraction, imagery, relaxation, massage, acupressure, TENS, heat, and cold application; Reviewed with patient prescribed pharmacological and nonpharmacological pain relief strategies; Pharm D consult to see if the patient can obtain Mel[facility]am [facility]      Neck mass  (Status: Goal on track: NO.) Evaluation of current treatment plan related to  neck mass , Financial constraints related to no health insurance  and Level of care concerns self-management and patient's adherence to plan as established by provider.07-05-2021: The patient states that he sometimes thinks the mass is larger and "tight", sometimes it does not feel as large or tight. Still has been unable to see a specialist due to no insurance and cost constraints. Education and support given. 12-5-2022Martin Majestic to [facility] ER to be evaluated and was disappointed as they would not help him. They had him in and out in no time. They did a MRI but he was still discharged home without any other help. He wants to see what needs to be done so he can get the needed attention he needs to manage his health and well being. Working with the CCM team.  Discussed plans with patient for ongoing care management follow up and provided patient with direct contact information for care management team Advised patient to file out application for CAFA, the patient has no insurance and the  neck mass is highly suspicious for malignancy and needs to be treated. The CCM team is working with the patient to get health care and treatment for chronic conditions. 05-27-2021: The patient is waiting to hear back from completed applications. [facility]. 07-05-2021: Paperwork was not filled out correctly. They have sent him a new packet. The patient states he is unsure how to fill it out appropriately. Have reached out to the LCSW for assistance. 08-02-2021: The patient has almost completed his new paperwork. His daughter is helping him with the application. Will hopefully have it finished tonight and ready for resubmission.  Provided education to patient re: CCM team and helping paitent achieve his health and wellness goals ; Collaborated with CCM team and pcp  regarding financial constraints and resources provided for the patient to obtain needed care for neck mass and other health related needs. ; Discussed plans with patient for ongoing care management follow up and provided patient with direct contact information for care management team;   Smoking Cessation: (Status: Patient declined further engagement on this goal.) Reviewed smoking history:  tobacco abuse of 40 plus years; currently smoking 0.5 ppd Previous quit attempts, unsuccessful not any successful using has never tried   Reports smoking within 30 minutes of waking up  Reports triggers to smoke include: anxiety, grief, depression, life stressors Reports motivation to quit smoking includes: not interested at this time On a scale of 1-10, reports MOTIVATION to quit is not interested at this time On a scale of 1-10, reports CONFIDENCE in quitting is Not interested at this time. Reviewed with the patient there are ways to help with smoking cessation when he is ready   Evaluation of current treatment plan reviewed; Advised patient to discuss smoking cessation options with provider; Provided  contact information for [place] Quit Line (1-800-QUIT-NOW); Provided patient with printed smoking cessation educational materials; Discussed plans with patient for ongoing care management follow up and provided patient with direct contact information for care management team;   Patient Goals/Self-Care Activities: Patient will self administer medications as prescribed Patient will attend all scheduled provider appointments Patient will call pharmacy for medication refills Patient will attend church or other social activities Patient will continue to perform ADL's independently Patient will continue to perform IADL's independently Patient will call provider office for new concerns or questions Patient will work with BSW to address care coordination needs and will continue to work with the clinical team to address health care and disease management related needs.   Patient will obtain needed resources for SDOH to assist with maintaining health and well being      Our next appointment is by telephone on 10-04-2021  at 230 pm  Please call the care guide team at [phone] if you need to cancel or reschedule your appointment.   If you are experiencing a Mental Health or Center Line or need someone to talk to, please call the Suicide and Crisis Lifeline: 988 call the [place] National Suicide Prevention Lifeline: 615 378 3856 or TTY: (919)011-5222 TTY 604 053 4783) to talk to a trained counselor call 1-800-273-TALK (toll free, 24 hour hotline)   Patient verbalizes understanding of instructions provided today and agrees to view in [place].   Telephone follow up appointment with care management team member scheduled for: 10-04-2021 at 230 pm   Noreene Larsson RN, MSN, [place] Mobile: [phone]

## 2021-08-02 NOTE — Chronic Care Management (AMB) (Signed)
Care Management    RN Visit Note  08/02/2021 Name: Sean Blake MRN: [id number] DOB: 1971/04/07  Subjective: Sean Blake is a 50 y.o. year old male who is a primary care patient of Sean Hauser, DO. The care management team was consulted for assistance with disease management and care coordination needs.    Engaged with patient by telephone for follow up visit in response to provider referral for case management and/or care coordination services.   Consent to Services:   Sean Blake was given information about Care Management services today including:  Care Management services includes personalized support from designated clinical staff supervised by his physician, including individualized plan of care and coordination with other care providers 24/7 contact phone numbers for assistance for urgent and routine care needs. The patient may stop case management services at any time by phone call to the office staff.  Patient agreed to services and consent obtained.   Assessment: Review of patient past medical history, allergies, medications, health status, including review of consultants reports, laboratory and other test data, was performed as part of comprehensive evaluation and provision of chronic care management services.   SDOH (Social Determinants of Health) assessments and interventions performed:    Care Plan  Allergies  Allergen Reactions   Gabapentin Palpitations    Outpatient Encounter Medications as of 08/02/2021  Medication Sig Note   ibuprofen (ADVIL) 200 MG tablet Take 200 mg by mouth every 6 (six) hours as needed. 04/19/2021: Reports taking 2 tablets (400 mg) twice daily as needed   meloxicam (MOBIC) 15 MG tablet Take 1 tablet (15 mg total) by mouth daily as needed for pain (arthritis joint pain). (Patient not taking: Reported on 04/19/2021)    No facility-administered encounter medications on file as of 08/02/2021.    There are no problems to display  for this patient.   Conditions to be addressed/monitored: HTN, Anxiety, Depression, Homelessness, Osteoarthritis, Tobacco Use, and grief and neck mass  Care Plan : RNCM: Adult General plan of care for neck mass, HTN, grief, anxiety, depression, osteoarthritis pain, frequent falls, homeless, and smoker  Updates made by Sean Ingles, RN since 08/02/2021 12:00 AM     Problem: RNCM: Adult General plan of care for neck mass, HTN, grief, anxiety, depression, osteoarthritis pain, frequent falls, homeless, and smoker   Priority: High     Long-Range Goal: RNCM: Adult General plan of care for neck mass, HTN, grief, anxiety, depression, osteoarthritis pain, frequent falls, homeless, and smoker   Start Date: 04/15/2021  Expected End Date: 04/15/2022  Recent Progress: On track  Priority: High  Note:   Current Barriers:  Knowledge Deficits related to plan of care for management of HTN, Homelessness, Anxiety with Excessive Worry, neck mass, osteoarthritis, high risk for falls, and smokes  Social Anxiety,, Depression: depressed mood, insomnia, fatigue, anxiety, loss of energy/fatigue, disturbed sleep, weight gain,, Grief, Insomnia/Sleep Difficulties, Stress at the loss of his son and other deaths within a 6 month period of time , and PTSD, and limited resources due to no income   Care Coordination needs related to Financial constraints related to homelessness and inability to afford the basic necessities , Limited social support, Limited access to food, Housing barriers, Level of care concerns, Medication procurement, ADL IADL limitations, Mental Health Concerns , Substance abuse issues -  smoker, occasional pot smoker, occasional alcohol use, Social Isolation, Lack of essential utilities - homeless*, and Lacks knowledge of community resource: needs help with finances, medications cost, finding  a place to stay (sleeps in his truck a lot), medical care for neck mass likely malignant)   Chronic Disease  Management support and education needs related to HTN, Homelessness, Anxiety with Excessive Worry, Social Anxiety,, Depression: depressed mood, insomnia, fatigue, anxiety, disturbed sleep, weight gain,, Grief, Insomnia/Sleep Difficulties, and Stress at loss of son and others within a 6 month period of time, and chronic pain, falls,  smoker, and neck mass Lacks caregiver support.  Film/video editor.  Difficulty obtaining medications Homeless and neck mass highly suspicious for malignancy   RNCM Clinical Goal(s):  Patient will verbalize understanding of plan for management of HTN, Anxiety, Depression, Homelessness, and grief, neck mass, osteoarthritis, and high risk for falls verbalize basic understanding of HTN, Anxiety, Depression, Homelessness, and neck mass, osteoarthritis, grief, and smoker disease process and self health management plan  take all medications exactly as prescribed and will call provider for medication related questions demonstrate understanding of rationale for each prescribed medication work with the pharm D on cost constraints  attend all scheduled medical appointments: saw pcp on 04-13-2021, will follow up again with pcp in about one month. 08-02-2021:  No upcoming appointments with the pcp  demonstrate improved and ongoing adherence to prescribed treatment plan for HTN, Anxiety, Depression, Homelessness, and neck mass, osteoarthritis, smoker, and fall risk as evidenced by adherence to prescribed medication regimen contacting provider for new or worsened symptoms or questions working with the CCM team to assist with resources to help with maintaining health and well being   demonstrate improved and ongoing health management independence getting the assistance he needs to help with his multiple chronic conditions and establish essentials to improve health and well being  continue to work with RN Care Manager to address care management and care coordination needs related to  HTN, Anxiety, Depression, Homelessness, and neck mass, osteoarthritis, grief, smoker, and frequent falls  work with pharmacist to address Financial constraints related to being able to afford basic necessities , Limited social support, Limited access to food, Housing barriers, Level of care concerns, Medication procurement, ADL IADL limitations, Mental Health Concerns , Substance abuse issues -  smoker, occasional smoker of pot, occasional alcohol use, and Social Isolation related to HTN, Anxiety, Depression, Homelessness, and neck mass, grief, smoker, osteoarthritis, and high falls risk work with Education officer, museum to address Financial constraints related to the need to obtain housing, and resources to pay for chronic disease management,medications, Limited social support, Limited access to food, Housing barriers, Level of care concerns, ADL IADL limitations, Mental Health Concerns , Substance abuse issues -  smoker, alcohol use and pot use , and Social Isolation related to the management of HTN, Anxiety, Depression, Homelessness, and grief, neck mass, osteoarthritis, falls, and smoker work with Data processing manager care guide to address needs related to Financial constraints related to financial needs for housing, food, and health care, Limited social support, Limited access to food, Housing barriers, and Level of care concerns  demonstrate a decrease in HTN, Anxiety, Depression, Homelessness, and grief, neck mass, osteoarthritis, falls and smoker  exacerbations  demonstrate ongoing self health care management ability by working with the CCM team to meet needs expressed through collaboration with Consulting civil engineer, provider, and care team.   Interventions: 1:1 collaboration with primary care provider regarding development and update of comprehensive plan of care as evidenced by provider attestation and co-signature Inter-disciplinary care team collaboration (see longitudinal plan of care) Evaluation of current  treatment plan related to chronic disease management and  self management  and patient's adherence to plan as established by provider   SDOH Barriers (Status: Goal on track: NO.)  Patient interviewed and SDOH assessment performed        SDOH Interventions    Flowsheet Row Most Recent Value  SDOH Interventions   Housing Interventions Other (Comment)  [sleeps in his truck]  Physical Activity Interventions Other (Comments)  [limited mobility- cannot tolerated standing more than 10-15 mins]  Social Connections Interventions Other (Comment)  [has children and friends, good support system]  Transportation Interventions Other (Comment)  [sometimes sleeps in his truck]     Patient interviewed and appropriate assessments performed Referred patient to community resources care guide team for assistance with housing needs, food resources and community resources to assist with SDOH and basic needs. 04-22-2021: The patient is actively working with the CCM team and care guides to get what he needs to obtain medication, food resources, housing, and health care. [facility]. Turned in application yesterday for the housing authority. 05-27-2021: Continues to work on paperwork and working with the CCM team. Is making progress. 07-05-2021: The patient remains homeless and is staying with friends but has to move in 2 weeks as they have ask him to be gone before the holidays get here. The patient states he has nowhere to go. Collaboration with the LCSW and a new care guide referral placed. Education and support given to the patient who was tearful during the call. Has been told that the paperwork he submitted is not acceptable and he needs to fill out new paperwork. Empathetic listening.  Provided mental health counseling with regard to grief, depression and anxiety (mental health diagnosis or concern).  07-05-2021: the patient requested help with getting  psychiatric assistance. Denies suicidal ideation but feels nothing is going right for him and he can't catch a break. Collaborated with the LCSW and scheduler to get the patient a follow up with LCSW. The LCSW is going to work on reaching out to the patient today to assist with needs. Provided patient with information about griefshare.org and sent information by email to the patient with CAFA. Instructed the patient to fill out and get needed documents for application to see if he could be approved for assistance in the [facility] system for multiple chronic conditions. 04-22-2021: Has filled out CAFA form and sent form back in to the email listed on the form. 07-05-2021: States paperwork not filled out correctly and a new packet was sent to him. Transportation barrier also. New referral for care guides generated. Discussed plans with patient for ongoing care management follow up and provided patient with direct contact information for care management team Advised patient to look for email with CAFA application, look for email with griefshare information, expect calls from the pharm D, the LCSW, the care guides, and ongoing support from the [facility], also advised the patient to call for questions, help, or concerns with filing out paperwork for CAFA.  Collaborated with RN Case Manager re: SDOH needs, Chronic disease management and care coordination needs  Collaborated with care guides  (community agency) re: housing, food resources, cost constraints, no income coming in at this time.  Assisted patient/caregiver with obtaining information about health plan benefits Provided education to patient/caregiver regarding level of care options. Referred patient to LCSW for long term follow up and therapy/counseling in relation to anxiety, depression, grief, and substance use. 07-05-2021: Secure chat with the LCSW for follow up  with the patient ASAP  08-02-2021: The patient remains homeless, but is staying with his  step-daughter and ex-wife. He says this is short term. Is finishing paperwork up and also working with disability. Has upcoming appointment in January for disability. Is working with housing authority to get help with housing.     Falls:  (Status: Goal on track: YES.) Provided written and verbal education re: potential causes of falls and Fall prevention strategies Reviewed medications and discussed potential side effects of medications such as dizziness and frequent urination Advised patient of importance of notifying provider of falls Assessed for signs and symptoms of orthostatic hypotension. 05-27-2021: Review of hypotension. The patient was having hypotension at the time of his event on 05-19-2021 and being evaluated in the ER. 08-02-2021: No issues with orthostatic hypotension at this time. Assessed for falls since last encounter. 04-22-2021: Denies any new falls at this time. Will continue to monitor for changes. 05-27-2021: The patient had a near fall experience on 05-19-2021 when he got dehydrated and too hot. He states that he was evaluated in the ER. Got fluid resuscitation and discharged home. Is feeling better. Review of safety and staying hydrated. 08-02-2021: Denies any new falls at this time. Assessed patients knowledge of fall risk prevention secondary to previously provided education  Grief, depression, and anxiety   (Status: Goal on Track (progressing): YES.) Evaluation of current treatment plan related to Anxiety, Depression, Homelessness, and grief , Financial constraints related to inability to afford health care, basic needs and medicaitons , Limited social support, Limited access to food, Housing barriers, Level of care concerns, ADL IADL limitations, Mental Health Concerns , Substance abuse issues -  smoker, alcohol use and pot use, and Social Isolation self-management and patient's adherence to plan as established by provider. 04-22-2021: Has filled out several applications for  assistance with medications, housing, healthcare affordability and food resources. [facility] of the call. Will continue to monitor for changes. 05-27-2021: Is getting paperwork processed and working on applications. Is working with the LCSW for help with effective management of mental health and well being. 07-05-2021: The patient is very emotional and tearful. States that he still needs to find a place to live and he is not having any improvement on his health. Feels he needs a psychiatrist. Secure messaging with the LCSW for support and help with expressed needs. The patient having issues with transportation, filling out paperwork and getting basic needs. Collaboration with the team. 08-02-2021: The patient is in better spirits today and says he is not in a "funk" like he had been. He is making progress. He did go to [facility] ER and thought that he would be able to get some help with his medical issues but he did not. He was very disappointed with the service there. He is finishing his paperwork with the help of his daughter and has appointments set up for disability and food stamps.  Discussed plans with patient for ongoing care management follow up and provided patient with direct contact information for care management team Advised patient to fill out CAFA application and follow up with the SW through the Medicaid office to help with obtaining updated information and apply for full Medicaid. The patient only has family planning Medicaid; Provided education to patient re: griefshare.org information and local groups to help with his grief over the loss of his son and other family and friends over a 6 month period  of time. ; Collaborated with LCSW regarding CAFA application and the RNCM being able to reach out sooner to the patient to assist with the needs expressed with pcp on 04-13-2021, follow up with the LCSW on 04-27-2021. 05-27-2021: Ongoing support and  education with the LCSW and CCM team. 07-05-2021: Reached out to the LCSW by secure chat with urgent request to reach out to the patient for assistnance with expressed needs. 08-02-2021: Is working with the LCSW for help with housing and paperwork needs.  Provided patient and/or caregiver with housing, food resources, community resources information about care guides calling him for assistance with resources for [employer] 05-27-2021: Is currently working with the care guides for housing information, food resources, and other community resources. 07-05-2021: Still needs housing. Collaboration with the LCSW and care guides for assistance with expressed needs. 08-02-2021: Is working with the LCSW for ongoing education and support. Provided patient with grief support and CAFA educational materials related to grief and loss and financial assistance through [facility]; Home referral for housing, food resources, and other resources to help the patient in [place]. 05-27-2021: Is currently working with the care guides for needed resources. 08-02-2021: Completed.  Social Work referral for program assistance, mental health counseling and support. Appointment with the LCSW on 04-27-2021. 08-02-2021: Actively working with the LCSW at this time.  Discussed plans with patient for ongoing care management follow up and provided patient with direct contact information for care management team; Screening for signs and symptoms of depression related to chronic disease state;  Assessed social determinant of health barriers;   Health Maintenance (Status: Goal on track: YES.)  Patient interviewed about adult health maintenance status including Depression screen    Falls risk assessment    Blood Pressure      Advised patient to discuss Depression screen    Falls risk assessment    Blood Pressure    with primary care provider       Hypertension: (Status: Goal on track: YES.) Last  practice recorded BP readings:  BP Readings from Last 3 Encounters:  05/19/21 126/73  04/13/21 (!) 149/97  05/29/18 (!) 141/87  Most recent eGFR/CrCl: No results found for: EGFR  No components found for: CRCL  Evaluation of current treatment plan related to hypertension self management and patient's adherence to plan as established by provider.  05-27-2021: The patient denies any issues with HTN at this time. The patient is working on establishing permanent housing and getting help with needed health care cost. Seen in the ER recently for dehydration with hypotension. Is feeling better now. 08-02-2021: Feels his blood pressure is stable. No new issues at this time with heart health;   Provided education to patient re: stroke prevention, s/s of heart attack and stroke. 08-02-2021: Review of uncontrolled blood pressure and the ill effects on body systems. Reviewed prescribed diet heart healthy diet. 08-02-2021: Adheres to heart healthy diet Reviewed medications with patient and discussed importance of compliance. 08-02-2021: States compliance with medications, working with pharm D;  Counseled on adverse effects of illicit drug and excessive alcohol use in patients with high blood pressure;  Discussed plans with patient for ongoing care management follow up and provided patient with direct contact information for care management team; Provided education on prescribed diet heart healthy diet, will send information by EMMI and my chart;  Discussed complications of poorly controlled blood pressure such as heart disease, stroke, circulatory complications, vision complications, kidney impairment, sexual dysfunction;   Pain:  (  Status: Goal on track: YES.) Pain assessment performed- patient with osteoarthritis limiting him from doing his usual work at [employer] Medications reviewed- only takes Meloxicam 05-27-2021: Working with the [facility] to get Meloxicam. States Meloxicam helps with his pain management.  07-05-2021: The patient has Meloxicam. He is taking as prescribed. Rates his pain at a 6 at the time of the call. 08-02-2021: The patient states pain level is at a 6 today on a scale of 0-10. The patient denies any acute distress. Takes medications as prescribed. Wants to get financial assistance so he can manage his health and well being better. Working with the CCM team.  Reviewed provider established plan for pain management; Discussed importance of adherence to all scheduled medical appointments; Counseled on the importance of reporting any/all new or changed pain symptoms or management strategies to pain management provider; Advised patient to report to care team affect of pain on daily activities; Discussed use of relaxation techniques and/or diversional activities to assist with pain reduction (distraction, imagery, relaxation, massage, acupressure, TENS, heat, and cold application; Reviewed with patient prescribed pharmacological and nonpharmacological pain relief strategies; Pharm D consult to see if the patient can obtain [facility]    Neck mass  (Status: Goal on track: NO.) Evaluation of current treatment plan related to  neck mass , Financial constraints related to no health insurance  and Level of care concerns self-management and patient's adherence to plan as established by provider.07-05-2021: The patient states that he sometimes thinks the mass is larger and "tight", sometimes it does not feel as large or tight. Still has been unable to see a specialist due to no insurance and cost constraints. Education and support given. 12-5-2022Martin Majestic to [facility] ER to be evaluated and was disappointed as they would not help him. They had him in and out in no time. They did a MRI but he was still discharged home without any other help. He wants to see what needs to be done so he can get the needed attention he needs to manage his health and well being. Working with the  CCM team.  Discussed plans with patient for ongoing care management follow up and provided patient with direct contact information for care management team Advised patient to file out application for CAFA, the patient has no insurance and the neck mass is highly suspicious for malignancy and needs to be treated. The CCM team is working with the patient to get health care and treatment for chronic conditions. 05-27-2021: The patient is waiting to hear back from completed applications. [facility]. 07-05-2021: Paperwork was not filled out correctly. They have sent him a new packet. The patient states he is unsure how to fill it out appropriately. Have reached out to the LCSW for assistance. 08-02-2021: The patient has almost completed his new paperwork. His daughter is helping him with the application. Will hopefully have it finished tonight and ready for resubmission.  Provided education to patient re: CCM team and helping paitent achieve his health and wellness goals ; Collaborated with CCM team and pcp  regarding financial constraints and resources provided for the patient to obtain needed care for neck mass and other health related needs. ; Discussed plans with patient for ongoing care management follow up and provided patient with direct contact information for care management team;  Smoking Cessation: (Status: Patient declined further engagement on this goal.) Reviewed smoking history:  tobacco abuse of 40 plus  years; currently smoking 0.5 ppd Previous quit attempts, unsuccessful not any successful using has never tried   Reports smoking within 30 minutes of waking up Reports triggers to smoke include: anxiety, grief, depression, life stressors Reports motivation to quit smoking includes: not interested at this time On a scale of 1-10, reports MOTIVATION to quit is not interested at this time On a scale of 1-10, reports CONFIDENCE in quitting is Not  interested at this time. Reviewed with the patient there are ways to help with smoking cessation when he is ready  Evaluation of current treatment plan reviewed; Advised patient to discuss smoking cessation options with provider; Provided contact information for [facility] (1-800-QUIT-NOW); Provided patient with printed smoking cessation educational materials; Discussed plans with patient for ongoing care management follow up and provided patient with direct contact information for care management team;  Patient Goals/Self-Care Activities: Patient will self administer medications as prescribed Patient will attend all scheduled provider appointments Patient will call pharmacy for medication refills Patient will attend church or other social activities Patient will continue to perform ADL's independently Patient will continue to perform IADL's independently Patient will call provider office for new concerns or questions Patient will work with BSW to address care coordination needs and will continue to work with the clinical team to address health care and disease management related needs.   Patient will obtain needed resources for SDOH to assist with maintaining health and well being       Plan: Telephone follow up appointment with care management team member scheduled for:  10-04-2021 at 18 pm  Noreene Larsson RN, MSN, [place] Mobile: [phone]

## 2021-08-02 NOTE — Chronic Care Management (AMB) (Signed)
  Care Management   Note  08/02/2021 Name: Sean Blake MRN: [id number] DOB: 01-Sep-1970  Sean Blake is a 50 y.o. year old male who is a primary care patient of [name], DO and is actively engaged with the care management team. I reached out to [name] by phone today to assist with re-scheduling a follow up visit with the Licensed Clinical Social Worker  Follow up plan: Unsuccessful telephone outreach attempt made. A HIPAA compliant phone message was left for the patient providing contact information and requesting a return call.  The care management team will reach out to the patient again over the next 7 days.  If patient returns call to provider office, please advise to call [facility]  at [place], [place], [place], [place] [address] Direct Dial: [phone] [email or web address] Website: [email or web address]

## 2021-08-02 NOTE — Chronic Care Management (AMB) (Signed)
  Care Management   Note  08/02/2021 Name: Sean Blake MRN: [id number] DOB: 03-Jan-1971  Sean Blake is a 50 y.o. year old male who is a primary care patient of [name], DO and is actively engaged with the care management team. I reached out to [name] by phone today to assist with re-scheduling a follow up visit with the Licensed Clinical Social Worker  Follow up plan: Telephone appointment with care management team member scheduled for:08/04/2021  Noreene Larsson, [place], [place] Management  [place], [place] [address] Direct Dial: [phone] [email or web address] Website: [email or web address]

## 2021-08-04 ENCOUNTER — Ambulatory Visit: Payer: Medicaid Other | Admitting: Licensed Clinical Social Worker

## 2021-08-05 NOTE — Patient Instructions (Signed)
Visit Information  Thank you for taking time to visit with me today. Please don't hesitate to contact me if I can be of assistance to you before our next scheduled telephone appointment.  Following are the goals we discussed today:  Patient Goals/Self-Care Activities: Over the next 120 days Participation in mental health treatment encouraged Strategies to manage emotional triggers promoted Practice positive thinking and self-talk Follow up on insurance options (CAFA) Contact office with any questions or concerns Attend all scheduled appointments with providers  Our next appointment is by telephone on 08/11/21 at 2:30 PM  Please call the care guide team at [phone] if you need to cancel or reschedule your appointment.   If you are experiencing a Mental Health or [place] or need someone to talk to, please call the Suicide and Crisis Lifeline: 988 call 911   Patient verbalizes understanding of instructions provided today and agrees to view in [place].   [name], MSW, [facility] [address] Care Management [email or web address] Phone [phone] 9:54 AM

## 2021-08-05 NOTE — Chronic Care Management (AMB) (Signed)
Care Management Clinical Social Work Note  08/05/2021 Name: Sean Blake MRN: [id number] DOB: 1971-04-28  Sean Blake is a 50 y.o. year old male who is a primary care patient of [name], DO.  The Care Management team was consulted for assistance with chronic disease management and coordination needs.  Engaged with patient by telephone for follow up visit in response to provider referral for social work chronic care management and care coordination services  Consent to Services:  Mr. [name] was given information about Care Management services today including:  Care Management services includes personalized support from designated clinical staff supervised by his physician, including individualized plan of care and coordination with other care providers 24/7 contact phone numbers for assistance for urgent and routine care needs. The patient may stop case management services at any time by phone call to the office staff.  Patient agreed to services and consent obtained.   Consent to Services:  The patient was given information about Care Management services, agreed to services, and gave verbal consent prior to initiation of services.  Please see initial visit note for detailed documentation.   Patient agreed to services today and consent obtained.  Engaged with patient by phone in response to provider referral for social work care coordination services:  Assessment/Interventions:  Patient continues to maintain positive progress with care plan goals. He has completed [facility] application and submitted CAFA ppwk for CCM LCSW to review. Patient has scheduled appts with Disability and has obtained temporary safe housing  See Care Plan below for interventions and patient self-care activities.  Recent life changes or stressors: Management of health conditions and obtaining medical coverage  Recommendation: Patient may benefit from, and is in agreement work with LCSW to  address care coordination needs and will continue to work with the clinical team to address health care and disease management related needs.   Follow up Plan: Patient would like continued follow-up from CCM LCSW.  per patient's request will follow up in 08/11/21.  Will call office if needed prior to next encounter.  SDOH (Social Determinants of Health) assessments and interventions performed:    Advanced Directives Status: Not addressed in this encounter.  Care Plan  Allergies  Allergen Reactions   Gabapentin Palpitations    Outpatient Encounter Medications as of 08/04/2021  Medication Sig Note   ibuprofen (ADVIL) 200 MG tablet Take 200 mg by mouth every 6 (six) hours as needed. 04/19/2021: Reports taking 2 tablets (400 mg) twice daily as needed   meloxicam (MOBIC) 15 MG tablet Take 1 tablet (15 mg total) by mouth daily as needed for pain (arthritis joint pain). (Patient not taking: Reported on 04/19/2021)    No facility-administered encounter medications on file as of 08/04/2021.    There are no problems to display for this patient.   Conditions to be addressed/monitored: Anxiety and Depression; Financial constraints related to medical coverage  Care Plan : LCSW Plan of Care  Updates made by [name], LCSW since 08/05/2021 12:00 AM     Problem: Barriers to Treatment      Goal: Barriers to Treatment Identified and Managed   Start Date: 04/27/2021  This Visit's Progress: On track  Recent Progress: On track  Priority: High  Note:   Current barriers:   Financial strain, Limited social support, Housing barriers, and Mental Health Concerns  Needs Support, Education, and Care Coordination in order to meet unmet mental health needs. Clinical Goal(s): demonstrate a reduction in symptoms related to :  Anxiety  and Depression  explore community resource options for unmet needs related IW:PYKDXIPJA constraints and No health insurance    Clinical Interventions:  Assessed patient's  previous and current treatment, coping skills, support system and barriers to care  Patient reports stress triggered by upcoming court date scheduled 05/26/21 regarding a case from a couple of years ago. Patient agreed to contact the DA to request assistance in dismissing the charges. Patient reports that he is working on a way to travel to [facility] where the case is pending Patient reports that he is interested in establishing counseling to address symptoms of depression and anxiety triggered by extensive trauma and grief. CCM LCSW discussed local agencies that provide counseling and medication management. Resources will be provided via e-mail for future reference 09/27: Patient prefers to obtain psychiatric and/or counseling through [facility]. Prefers to complete CAFA application and have PCP complete referral to [facility] 11/7: Patient reports feelings of anxiety. He has been residing with a friend; however, they need him to leave in the next two weeks. Patient reports difficulty managing feelings of sadness, irritability, and being around others "I go into a cold sweat" Denies SI/HI 12/07: Patient is currently residing with step-daughter, which offers him improved accessibility to PCP office  Patient has provided disability CM consent to review medical bills. He has not heard from them about the appeal. Patient currently does not have any income.CCM LCSW provided patient with local emergency shelters. Patient reports that he is unable to being around people 12/07: Patient has an upcoming appt with disability on 09/08/21 Patient reports difficulty affording his medications. CCM LCSW provided information on [facility] Patient will submit application to [facility] when state I.D. arrives 09/13: Patient reports that his state ID came in the mail and he will submit application this week 25/05: Patient reports that he has been without a vehicle for approximately one week. During  this time, he has been unable to submit application to [facility] or drop off CAFA to PCP office. Patient agreed to mail application and supportive documentation to PCP office within this week 11/7: Patient agreed to submit application on 39/76/73 12/07: Patient reports completion of application. Needs only to submit Patient shared that he is feeling better after recent ED visit. He currently has money to pick up his meds. CCM LCSW informed him that they are still available and verified patient's pharmacy CCM LCSW reviewed upcoming appointments Patient reports that he received CAFA application via e-mail 41/93: CCM LCSW mailed a print out of CAFA application, per patient request. CCM LCSW reviewed required documents associated with application 79/02: Patient has provided CAFA application at PCP office for LCSW to review. He is waiting to receive FS Award Letter, to add to application Patient has completed intake for Disability Claim. States he has not heard back from [place]. Patient agreed to obtain contact information and provide to CCM LCSW to strengthen support regarding next steps CCM LCSW discussed benefits of obtaining assistance from Legal Aid to assist with renewal of SNAP benefits. LCSW will complete Legal Aid Referral CCM LCSW collaborated with [facility] regarding plan of care for patient. RNCM has completed a Care Guide referral to assist with housing/financial strain Review various resources, discussed options and provided patient information about Various insurance options ( [employer], [employer]  and [employer]) Depression screen reviewed , Solution-Focused Strategies, Mindfulness or Psychologist, educational, Active listening / Reflection utilized , Emotional Supportive Provided, Provided  psychoeducation for mental health needs , Verbalization of feelings encouraged , and Crisis Resource Education / information provided   1:1 collaboration  with primary care provider regarding development and update of comprehensive plan of care as evidenced by provider attestation and co-signature Inter-disciplinary care team collaboration (see longitudinal plan of care) Patient Goals/Self-Care Activities: Over the next 120 days Participation in mental health treatment encouraged Strategies to manage emotional triggers promoted Practice positive thinking and self-talk Follow up on insurance options (CAFA) Contact office with any questions or concerns Attend all scheduled appointments with providers      [name], MSW, [email or web address] Phone [phone] 9:51 AM

## 2021-08-11 ENCOUNTER — Ambulatory Visit: Payer: Medicaid Other | Admitting: Licensed Clinical Social Worker

## 2021-08-11 ENCOUNTER — Ambulatory Visit: Payer: Medicaid Other | Admitting: Pharmacist

## 2021-08-11 DIAGNOSIS — M159 Polyosteoarthritis, unspecified: Secondary | ICD-10-CM

## 2021-08-11 NOTE — Chronic Care Management (AMB) (Signed)
Care Management   Pharmacy Note  08/11/2021 Name: Sean Blake MRN: [id number] DOB: 04-23-1971  Subjective: Sean Blake is a 50 y.o. year old male who is a primary care patient of [name], DO. The Care Management team was consulted for assistance with care management and care coordination needs.    Engaged with patient by telephone for follow up visit in response to provider referral for pharmacy case management and/or care coordination services.   The patient was given information about Care Management services today including:  Care Management services includes personalized support from designated clinical staff supervised by the patient's primary care provider, including individualized plan of care and coordination with other care providers. 24/7 contact phone numbers for assistance for urgent and routine care needs. The patient may stop case management services at any time by phone call to the office staff.  Patient agreed to services and consent obtained.  Assessment:  Review of patient status, including review of consultants reports, laboratory and other test data, was performed as part of comprehensive evaluation and provision of chronic care management services.   SDOH (Social Determinants of Health) assessments and interventions performed:    Objective:  Lab Results  Component Value Date   CREATININE 1.47 (H) 05/19/2021   CREATININE 0.83 05/29/2018     Care Plan  Allergies  Allergen Reactions   Gabapentin Palpitations    Medications Reviewed Today     Reviewed by [name], RPH-CPP (Pharmacist) on 08/11/21 at 69  Med List Status: <None>   Medication Order Taking? Sig Documenting Provider Last Dose Status Informant  ibuprofen (ADVIL) 200 MG tablet [id number] Yes Take 200 mg by mouth every 6 (six) hours as needed. [provider] Taking Active            Med Note Curley Spice, [facility] A   Mon Apr 19, 2021 10:31 AM) Reports  taking 2 tablets (400 mg) twice daily as needed  meloxicam (MOBIC) 15 MG tablet [id number] No Take 1 tablet (15 mg total) by mouth daily as needed for pain (arthritis joint pain).  Patient not taking: Reported on 04/19/2021   [name], DO Not Taking Active             Care Plan : PharmD - Medication Access  Updates made by [name], RPH-CPP since 08/11/2021 12:00 AM     Problem: Disease Progression      Long-Range Goal: Disease Progression Prevented or Minimized   Start Date: 04/19/2021  Expected End Date: 07/18/2021  Recent Progress: On track  Priority: High  Note:   Current Barriers:  Unable to independently afford treatment regimen  Pharmacist Clinical Goal(s):  Over the next 90 days, patient will verbalize ability to afford treatment regimen through collaboration with PharmD and provider.    Interventions: 1:1 collaboration with [name], DO regarding development and update of comprehensive plan of care as evidenced by provider attestation and co-signature Inter-disciplinary care team collaboration (see longitudinal plan of care) Perform chart review. Patient seen in [facility] on 11/26 for neck mass. Patient advised to follow up with PCP regarding back pain. Provider recommended patient use Tylenol for your pain, no more than 3 g daily.  Today patient reports continues using OTC ibuprofen as needed for knee/back/neck pain. Denies using acetaminophen as reports this upsets his stomach Counsel patient to take ibuprofen doses with food Denies taking other NSAIDs at this time. Reports working on getting appointment with mental health clinic  as discussed with CM Social Worker to discuss panic attacks. Note next outreach from [place] scheduled for this afternoon. Patient confirms he will be available for this call Patient mentions needing further support with transportation Note patient has previously  discuss transportation resources with [place]. Provide patient with Care Guide's phone number again today  Coordination of Care: Patient reports still working on submitting application for [facility] Encourage patient to follow up with [facility] today  Medication Assistance: Patient has reported meloxicam is unaffordable due to his lack of prescription coverage Based on patient's age, lack of prescription coverage and reported income, patient would meet requirements to apply for Medication Management Clinic [facility]). Have confirmed patient has [facility] phone number [phone]) and again encourage him to follow up today about completing clinic application process Patient requests CM Pharmacist call back in a couple of months to check in on him  Patient Goals/Self-Care Activities Over the next 90 days, patient will:  - collaborate with provider on medication access solutions  Follow Up Plan: The care management team will reach out to the patient again over the next 90 days.        Wallace Cullens, PharmD, [place], CPP Clinical Pharmacist [facility] [phone]

## 2021-08-11 NOTE — Patient Instructions (Addendum)
Thank you allowing the Care Management Team to be a part of your care! It was a pleasure speaking with you today!     Care Management Team    Noreene Larsson RN, MSN, CCM Nurse Care Coordinator  [phone]   Wallace Cullens, PharmD  Clinical Pharmacist  [phone]   [name], MSW, LCSW Clinical Social Worker [phone]   Visit Information   Goals Addressed             This Visit's Progress    Pharmacy Goals       It was a pleasure speaking with you today!  Please follow up with Medication Management Clinic  Phone: [phone] Address: [address] [place], [place] [address] Mailing Address: [address], [place], [place] [address]  Feel free to call me with any questions or concerns.   Wallace Cullens, PharmD, [place], CPP Clinical Pharmacist [facility] [phone]        The patient verbalized understanding of instructions, educational materials, and care plan provided today and declined offer to receive copy of patient instructions, educational materials, and care plan.   The care management team will reach out to the patient again over the next 90 days.

## 2021-08-12 NOTE — Chronic Care Management (AMB) (Signed)
Care Management Clinical Social Work Note  08/12/2021 Name: Sean Blake MRN: [id number] DOB: Sep 16, 1970  Sean Blake is a 50 y.o. year old male who is a primary care patient of [name], DO.  The Care Management team was consulted for assistance with chronic disease management and coordination needs.  Engaged with patient by telephone for follow up visit in response to provider referral for social work chronic care management and care coordination services  Consent to Services:  Sean Blake was given information about Care Management services today including:  Care Management services includes personalized support from designated clinical staff supervised by his physician, including individualized plan of care and coordination with other care providers 24/7 contact phone numbers for assistance for urgent and routine care needs. The patient may stop case management services at any time by phone call to the office staff.  Patient agreed to services and consent obtained.   Consent to Services:  The patient was given information about Care Management services, agreed to services, and gave verbal consent prior to initiation of services.  Please see initial visit note for detailed documentation.   Patient agreed to services today and consent obtained.  Engaged with patient by phone in response to provider referral for social work care coordination services:  Assessment/Interventions:  Patient continues to maintain positive progress with care plan goals. CCM LCSW commended patient for submitting paperwork for LCSW to review. Additional documents needed and LCSW will mail Letter of Support for pt to complete.  See Care Plan below for interventions and patient self-care activities.  Recent life changes or stressors: Financial Strain  Recommendation: Patient may benefit from, and is in agreement work with LCSW to address care coordination needs and will continue to work with  the clinical team to address health care and disease management related needs.   Follow up Plan: Patient would like continued follow-up from CCM LCSW.  per patient's request will follow up in 08/25/21.  Will call office if needed prior to next encounter.  SDOH (Social Determinants of Health) assessments and interventions performed:    Advanced Directives Status: Not addressed in this encounter.  Care Plan  Allergies  Allergen Reactions   Gabapentin Palpitations    Outpatient Encounter Medications as of 08/11/2021  Medication Sig Note   ibuprofen (ADVIL) 200 MG tablet Take 200 mg by mouth every 6 (six) hours as needed. 04/19/2021: Reports taking 2 tablets (400 mg) twice daily as needed   meloxicam (MOBIC) 15 MG tablet Take 1 tablet (15 mg total) by mouth daily as needed for pain (arthritis joint pain). (Patient not taking: Reported on 04/19/2021)    No facility-administered encounter medications on file as of 08/11/2021.    There are no problems to display for this patient.   Conditions to be addressed/monitored: Financial constraints related to obtaining medical insurance, Limited social support, Housing barriers, and Mental Health Concerns   Care Plan : LCSW Plan of Care  Updates made by [name], LCSW since 08/12/2021 12:00 AM     Problem: Barriers to Treatment      Goal: Barriers to Treatment Identified and Managed   Start Date: 04/27/2021  This Visit's Progress: On track  Recent Progress: On track  Priority: High  Note:   Current barriers:   Financial strain, Limited social support, Housing barriers, and Mental Health Concerns  Needs Support, Education, and Care Coordination in order to meet unmet mental health needs. Clinical Goal(s): demonstrate a reduction in symptoms related to :  Anxiety  and Depression  explore community resource options for unmet needs related KZ:[id number] constraints and No health insurance    Clinical Interventions:  Assessed patient's  previous and current treatment, coping skills, support system and barriers to care  Patient reports stress triggered by upcoming court date scheduled 05/26/21 regarding a case from a couple of years ago. Patient agreed to contact the DA to request assistance in dismissing the charges. Patient reports that he is working on a way to travel to [facility] where the case is pending Patient reports that he is interested in establishing counseling to address symptoms of depression and anxiety triggered by extensive trauma and grief. CCM LCSW discussed local agencies that provide counseling and medication management. Resources will be provided via e-mail for future reference 09/27: Patient prefers to obtain psychiatric and/or counseling through [facility]. Prefers to complete CAFA application and have PCP complete referral to [facility] 11/7: Patient reports feelings of anxiety. He has been residing with a friend; however, they need him to leave in the next two weeks. Patient reports difficulty managing feelings of sadness, irritability, and being around others I go into a cold sweat Denies SI/HI 12/07: Patient is currently residing with step-daughter, which offers him improved accessibility to PCP office  Patient has provided disability CM consent to review medical bills. He has not heard from them about the appeal. Patient currently does not have any income.CCM LCSW provided patient with local emergency shelters. Patient reports that he is unable to being around people 12/07: Patient has an upcoming appt with disability on 09/08/21 Patient reports difficulty affording his medications. CCM LCSW provided information on [facility] Patient will submit application to [facility] when state I.D. arrives 09/13: Patient reports that his state ID came in the mail and he will submit application this week 93/90: Patient reports that he has been without a vehicle for approximately one week. During  this time, he has been unable to submit application to [facility] or drop off CAFA to PCP office. Patient agreed to mail application and supportive documentation to PCP office within this week 11/7: Patient agreed to submit application on 30/09/23 12/07: Patient reports completion of application. Needs only to submit Patient shared that he is feeling better after recent ED visit. He currently has money to pick up his meds. CCM LCSW informed him that they are still available and verified patient's pharmacy CCM LCSW reviewed upcoming appointments Patient reports that he received CAFA application via e-mail 30/07: CCM LCSW mailed a print out of CAFA application, per patient request. CCM LCSW reviewed required documents associated with application 62/26: Patient has provided CAFA application at PCP office for LCSW to review. He is waiting to receive FS Award Letter, to add to application 33/35: CCM LCSW commended patient for submitting paperwork for LCSW to review. Additional documents needed and LCSW will mail Letter of Support for pt to complete Patient has completed intake for Disability Claim. States he has not heard back from [place]. Patient agreed to obtain contact information and provide to CCM LCSW to strengthen support regarding next steps CCM LCSW discussed benefits of obtaining assistance from Legal Aid to assist with renewal of SNAP benefits. LCSW will complete Legal Aid Referral CCM LCSW collaborated with [facility] regarding plan of care for patient. RNCM has completed a Care Guide referral to assist with housing/financial strain Review various resources, discussed options and provided patient information about Various insurance options ( [employer], [employer]  Financial Assistance and [place]) Depression screen reviewed , Solution-Focused Strategies, Mindfulness or Relaxation Training, Active listening / Reflection utilized , Emotional Supportive Provided,  Provided psychoeducation for mental health needs , Verbalization of feelings encouraged , and Crisis Resource Education / information provided   1:1 collaboration with primary care provider regarding development and update of comprehensive plan of care as evidenced by provider attestation and co-signature Inter-disciplinary care team collaboration (see longitudinal plan of care) Patient Goals/Self-Care Activities: Over the next 120 days Participation in mental health treatment encouraged Strategies to manage emotional triggers promoted Practice positive thinking and self-talk Follow up on insurance options (CAFA) Contact office with any questions or concerns Attend all scheduled appointments with providers      [name], MSW, [email or web address] Phone [phone] 9:38 AM

## 2021-08-12 NOTE — Patient Instructions (Signed)
Visit Information  Thank you for taking time to visit with me today. Please don't hesitate to contact me if I can be of assistance to you before our next scheduled telephone appointment.  Following are the goals we discussed today:  Patient Goals/Self-Care Activities: Over the next 120 days Participation in mental health treatment encouraged Strategies to manage emotional triggers promoted Practice positive thinking and self-talk Follow up on insurance options (CAFA) Contact office with any questions or concerns Attend all scheduled appointments with providers  Our next appointment is by telephone on 08/25/21 at 1:00 PM  Please call the care guide team at [phone] if you need to cancel or reschedule your appointment.   If you are experiencing a Mental Health or [place] or need someone to talk to, please call the Suicide and Crisis Lifeline: 988 call 911   Patient verbalizes understanding of instructions provided today and agrees to view in [place].   [name], MSW, [place] [facility] Care Management [email or web address] Phone [phone] 9:39 AM

## 2021-08-25 ENCOUNTER — Telehealth: Payer: Medicaid Other

## 2021-08-26 ENCOUNTER — Telehealth: Payer: Self-pay | Admitting: Licensed Clinical Social Worker

## 2021-08-26 NOTE — Telephone Encounter (Signed)
° °   Clinical Social Work  Care Management   Phone Outreach    08/26/2021 Name: Sean Blake MRN: [id number] DOB: 23-Feb-1971  Sean Blake is a 50 y.o. year old male who is a primary care patient of [name], DO .   Reason for referral: [employer] .    F/U phone call today to assess needs, progress and barriers with care plan goals.   Telephone outreach was unsuccessful. A HIPPA compliant phone message was left for the patient providing contact information and requesting a return call.   Plan:CCM LCSW will wait for return call.  Review of patient status, including review of consultants reports, relevant laboratory and other test results, and collaboration with appropriate care team members and the patient's provider was performed as part of comprehensive patient evaluation and provision of care management services.     [name], MSW, [place] [facility] Care Management [email or web address] Phone [phone] 8:52 AM

## 2021-09-07 ENCOUNTER — Telehealth: Payer: Medicaid Other

## 2021-09-08 ENCOUNTER — Telehealth: Payer: Self-pay | Admitting: Licensed Clinical Social Worker

## 2021-09-08 NOTE — Telephone Encounter (Signed)
° °   Clinical Social Work  Care Management   Phone Outreach    09/08/2021 Name: Sean Blake MRN: [id number] DOB: 01/23/1971  Sean Blake is a 51 y.o. year old male who is a primary care patient of [place] Ranger, Devonne Doughty, DO .   Reason for referral: [employer]  and [place] [employer].    F/U phone call today to assess needs, progress and barriers with care plan goals.   Telephone outreach was unsuccessful. Unable to leave a HIPPA compliant phone message due to voice mail not set up.  Plan:CCM LCSW will wait for return call. If no return call is received, LCSW will make another attempt within 14 days.  Review of patient status, including review of consultants reports, relevant laboratory and other test results, and collaboration with appropriate care team members and the patient's provider was performed as part of comprehensive patient evaluation and provision of care management services.    [name], MSW, [place] [facility] Care Management [email or web address] Phone [phone] 10:02 AM

## 2021-09-14 ENCOUNTER — Telehealth: Payer: Self-pay

## 2021-09-14 ENCOUNTER — Telehealth: Payer: Medicaid Other

## 2021-09-15 ENCOUNTER — Telehealth: Payer: Self-pay | Admitting: Licensed Clinical Social Worker

## 2021-09-15 ENCOUNTER — Ambulatory Visit: Payer: Medicaid Other | Admitting: Internal Medicine

## 2021-09-15 ENCOUNTER — Encounter: Payer: Self-pay | Admitting: Internal Medicine

## 2021-09-15 ENCOUNTER — Other Ambulatory Visit: Payer: Self-pay

## 2021-09-15 VITALS — BP 142/92 | HR 90 | Temp 97.9°F | Resp 18 | Ht 71.0 in | Wt 332.9 lb

## 2021-09-15 DIAGNOSIS — M1990 Unspecified osteoarthritis, unspecified site: Secondary | ICD-10-CM | POA: Insufficient documentation

## 2021-09-15 DIAGNOSIS — F32A Depression, unspecified: Secondary | ICD-10-CM | POA: Insufficient documentation

## 2021-09-15 DIAGNOSIS — G8929 Other chronic pain: Secondary | ICD-10-CM

## 2021-09-15 DIAGNOSIS — E669 Obesity, unspecified: Secondary | ICD-10-CM | POA: Insufficient documentation

## 2021-09-15 DIAGNOSIS — F419 Anxiety disorder, unspecified: Secondary | ICD-10-CM

## 2021-09-15 DIAGNOSIS — Z6841 Body Mass Index (BMI) 40.0 and over, adult: Secondary | ICD-10-CM

## 2021-09-15 DIAGNOSIS — M7989 Other specified soft tissue disorders: Secondary | ICD-10-CM | POA: Insufficient documentation

## 2021-09-15 DIAGNOSIS — Z0000 Encounter for general adult medical examination without abnormal findings: Secondary | ICD-10-CM

## 2021-09-15 NOTE — Progress Notes (Signed)
New Patient Office Visit  Subjective:  Patient ID: Sean Blake, male    DOB: 11-07-1970  Age: 51 y.o. MRN: [id number]  CC: No chief complaint on file.   HPI Sean Blake presents to establish care. Patient c/o chronic neck pain.  Patient states he was worked up at [facility] 12/2020 and was told he has cancer. Patient worked up at [facility] 06/2021 and work up showed no evidence of malignancy. Patient states he was diagnosed with Lyme disease ~5 years ago (2018). Patient does get sob with ambulation. Patient gets up 2 times a night to urinate.  Past Medical History:  Diagnosis Date   Anxiety    Arthritis     Past Surgical History:  Procedure Laterality Date   JOINT REPLACEMENT      No family history on file.  Social History   Socioeconomic History   Marital status: Legally Separated    Spouse name: Not on file   Number of children: Not on file   Years of education: Not on file   Highest education level: Not on file  Occupational History   Not on file  Tobacco Use   Smoking status: Every Day    Packs/day: 0.50    Years: 30.00    Pack years: 15.00    Types: Cigarettes   Smokeless tobacco: Never  Substance and Sexual Activity   Alcohol use: Not Currently    Alcohol/week: 4.0 standard drinks    Types: 4 Standard drinks or equivalent per week   Drug use: Not Currently    Types: Marijuana   Sexual activity: Not on file  Other Topics Concern   Not on file  Social History Narrative   Not on file   Social Determinants of Health   Financial Resource Strain: High Risk   Difficulty of Paying Living Expenses: Very hard  Food Insecurity: No Food Insecurity   Worried About Charity fundraiser in the Last Year: Never true   Ran Out of Food in the Last Year: Never true  Transportation Needs: No Transportation Needs   Lack of Transportation (Medical): No   Lack of Transportation (Non-Medical): No  Physical Activity: Inactive   Days of Exercise per Week: 0 days   Minutes  of Exercise per Session: 0 min  Stress: Stress Concern Present   Feeling of Stress : Very much  Social Connections: Socially Isolated   Frequency of Communication with Friends and Family: More than three times a week   Frequency of Social Gatherings with Friends and Family: More than three times a week   Attends Religious Services: Never   Marine scientist or Organizations: No   Attends Music therapist: Never   Marital Status: Divorced  Human resources officer Violence: Not At Risk   Fear of Current or Ex-Partner: No   Emotionally Abused: No   Physically Abused: No   Sexually Abused: No    ROS Review of Systems  Objective:  Physical Exam  Assessment & Plan:  1. Healthcare maintenance Will get routine labs and follow up by phone next week.  2. Mass of soft tissue of neck History of pain in right upper chest area near the clavicle and sternum area. CT done at [facility] 12/2020 suggested soft tissue area. No actual malignancy was diagnosed. Recent follow up CT same area at [facility] on 06/2021 through ED department demonstrated the same soft tissue mass appeared to be about 50% smaller. Radiologist suggested synovial inflammation with infusion in his  sternoclavicular joint. They saw no evidence of malignancy in his upper neck or mid chest area. They did have concerns with his large body habitus that might interfere with interpretation. See [facility] care everywhere notes.  3. Arthritis Multiple history of issues of arthritic symptoms involving knees, back and hands. 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  Remembers a specific insect bite to his right thigh 7-8 years ago with significant swelling and redness. Was seen by MD, no insect identified, was treated for possible tick bite for antibiotics. MD told him that he might get Lyme disease for the insect bite.  Plan comprehensive labs with possible referral to  rheumotology.  4. Anxiety and Depression Ongoing issue since the death of his son a few years ago. Patient would like to have some help.  Will refer to [employer], LCSW.   Problem List Items Addressed This Visit   None   Outpatient Encounter Medications as of 09/15/2021  Medication Sig   ibuprofen (ADVIL) 200 MG tablet Take 200 mg by mouth every 6 (six) hours as needed.   meloxicam (MOBIC) 15 MG tablet Take 1 tablet (15 mg total) by mouth daily as needed for pain (arthritis joint pain). (Patient not taking: Reported on 04/19/2021)   No facility-administered encounter medications on file as of 09/15/2021.    Follow-up: Will follow up 09/22/21 with telephone visit to discuss labs drawn today.  Sonnie Alamo, CMA

## 2021-09-15 NOTE — Telephone Encounter (Signed)
° °   Clinical Social Work  Care Management   Phone Outreach    09/15/2021 Name: KWABENA STRUTZ MRN: [id number] DOB: 01-31-1971  Orlan Leavens Dunnam is a 51 y.o. year old male who is a primary care patient of [name], DO .   Reason for referral: [employer] .    F/U phone call today to assess needs, progress and barriers with care plan goals.   Telephone outreach was unsuccessful. A HIPPA compliant phone message was left for the patient providing contact information and requesting a return call.   Plan:CCM LCSW will wait for return call. If no return call is received, LCSW will make another attempt within 30 days  Review of patient status, including review of consultants reports, relevant laboratory and other test results, and collaboration with appropriate care team members and the patient's provider was performed as part of comprehensive patient evaluation and provision of care management services.    [name], MSW, [place] [facility] Care Management [email or web address] Phone [phone] 12:06 PM

## 2021-09-16 LAB — COMPREHENSIVE METABOLIC PANEL
ALT: 27 IU/L (ref 0–44)
AST: 16 IU/L (ref 0–40)
Albumin/Globulin Ratio: 1.6 (ref 1.2–2.2)
Albumin: 4.2 g/dL (ref 3.8–4.9)
Alkaline Phosphatase: 78 IU/L (ref 44–121)
BUN/Creatinine Ratio: 21 — ABNORMAL HIGH (ref 9–20)
BUN: 19 mg/dL (ref 6–24)
Bilirubin Total: <0.2 mg/dL (ref 0.0–1.2)
CO2: 23 mmol/L (ref 20–29)
Calcium: 9.3 mg/dL (ref 8.7–10.2)
Chloride: 105 mmol/L (ref 96–106)
Creatinine, Ser: 0.89 mg/dL (ref 0.76–1.27)
Globulin, Total: 2.6 g/dL (ref 1.5–4.5)
Glucose: 113 mg/dL — ABNORMAL HIGH (ref 70–99)
Potassium: 4.6 mmol/L (ref 3.5–5.2)
Sodium: 141 mmol/L (ref 134–144)
Total Protein: 6.8 g/dL (ref 6.0–8.5)
eGFR: 103 mL/min/{1.73_m2} (ref >59)

## 2021-09-16 LAB — LIPID PANEL
Chol/HDL Ratio: 4.4 ratio (ref 0.0–5.0)
Cholesterol, Total: 206 mg/dL — ABNORMAL HIGH (ref 100–199)
HDL: 47 mg/dL (ref >39)
LDL Chol Calc (NIH): 131 mg/dL — ABNORMAL HIGH (ref 0–99)
Triglycerides: 157 mg/dL — ABNORMAL HIGH (ref 0–149)
VLDL Cholesterol Cal: 28 mg/dL (ref 5–40)

## 2021-09-16 LAB — CBC WITH DIFFERENTIAL/PLATELET
Basophils Absolute: 0 10*3/uL (ref 0.0–0.2)
Basos: 0 %
EOS (ABSOLUTE): 0.2 10*3/uL (ref 0.0–0.4)
Eos: 3 %
Hematocrit: 47.5 % (ref 37.5–51.0)
Hemoglobin: 16.2 g/dL (ref 13.0–17.7)
Immature Grans (Abs): 0 10*3/uL (ref 0.0–0.1)
Immature Granulocytes: 0 %
Lymphocytes Absolute: 3 10*3/uL (ref 0.7–3.1)
Lymphs: 36 %
MCH: 31.8 pg (ref 26.6–33.0)
MCHC: 34.1 g/dL (ref 31.5–35.7)
MCV: 93 fL (ref 79–97)
Monocytes Absolute: 0.6 10*3/uL (ref 0.1–0.9)
Monocytes: 7 %
Neutrophils Absolute: 4.5 10*3/uL (ref 1.4–7.0)
Neutrophils: 54 %
Platelets: 302 10*3/uL (ref 150–450)
RBC: 5.09 x10E6/uL (ref 4.14–5.80)
RDW: 13.1 % (ref 11.6–15.4)
WBC: 8.3 10*3/uL (ref 3.4–10.8)

## 2021-09-16 LAB — URINALYSIS, ROUTINE W REFLEX MICROSCOPIC
Bilirubin, UA: NEGATIVE
Glucose, UA: NEGATIVE
Ketones, UA: NEGATIVE
Leukocytes,UA: NEGATIVE
Nitrite, UA: NEGATIVE
RBC, UA: NEGATIVE
Specific Gravity, UA: 1.027 (ref 1.005–1.030)
Urobilinogen, Ur: 0.2 mg/dL (ref 0.2–1.0)
pH, UA: 5.5 (ref 5.0–7.5)

## 2021-09-16 LAB — MICROSCOPIC EXAMINATION: Casts: NONE SEEN /lpf

## 2021-09-16 LAB — ANA: Anti Nuclear Antibody (ANA): POSITIVE — AB

## 2021-09-16 LAB — TSH: TSH: 1.23 u[IU]/mL (ref 0.450–4.500)

## 2021-09-16 LAB — SEDIMENTATION RATE: Sed Rate: 36 mm/hr — ABNORMAL HIGH (ref 0–30)

## 2021-09-22 ENCOUNTER — Encounter: Payer: Self-pay | Admitting: Internal Medicine

## 2021-09-22 ENCOUNTER — Other Ambulatory Visit: Payer: Self-pay

## 2021-09-22 ENCOUNTER — Ambulatory Visit: Payer: Medicaid Other | Admitting: Internal Medicine

## 2021-09-22 ENCOUNTER — Institutional Professional Consult (permissible substitution): Payer: Medicaid Other | Admitting: Licensed Clinical Social Worker

## 2021-09-22 ENCOUNTER — Ambulatory Visit: Payer: Self-pay | Admitting: Licensed Clinical Social Worker

## 2021-09-22 DIAGNOSIS — F4310 Post-traumatic stress disorder, unspecified: Secondary | ICD-10-CM

## 2021-09-22 DIAGNOSIS — F339 Major depressive disorder, recurrent, unspecified: Secondary | ICD-10-CM

## 2021-09-22 DIAGNOSIS — F411 Generalized anxiety disorder: Secondary | ICD-10-CM

## 2021-09-22 DIAGNOSIS — M1990 Unspecified osteoarthritis, unspecified site: Secondary | ICD-10-CM

## 2021-09-22 DIAGNOSIS — Z0000 Encounter for general adult medical examination without abnormal findings: Secondary | ICD-10-CM | POA: Insufficient documentation

## 2021-09-22 NOTE — Progress Notes (Signed)
Established Patient Office Visit  Subjective:  Patient ID: Sean Blake, male    DOB: [birth year]  Age: 51 y.o. MRN: [id number]  CC:  Chief Complaint  Patient presents with   Follow-up    New patient labs drawn 09/15/21    HPI Sean Blake is a 51 y/o male who presents via telephone for follow up of new patient lab work. Most labs were normal. However his labs showed positive ANA, a mildly elevated sedimentation rate and some proteinuria. Referred to Dr. Jefm Bryant for rheumatologist appointment on 10/19/2021. Patient is requesting orthopedic referral for back and joint pain. Will let Dr. Jefm Bryant assess and determine if this referral is appropriate. Pt is requesting pain/anti-inflammatory medication other than ibuprofen. Explained that there are limited options for him to receive for free. Again, will let Dr. Jefm Bryant assess and determine best course of treatment.   Referred patient to clinical social worker for assessment who was unable to contact him. A message was left for him to contact.   Patient has an appointment with Nira Conn, our behavioral health counselor 09/22/2021.   Past Medical History:  Diagnosis Date   Anxiety    Arthritis     Past Surgical History:  Procedure Laterality Date   NO PAST SURGERIES      Family History  Problem Relation Age of Onset   Arthritis Mother    Healthy Father    Arthritis Maternal Grandmother    Hypertension Maternal Grandmother    Other Maternal Grandfather        unknown medical history   Alzheimer's disease Paternal Grandfather     Social History   Socioeconomic History   Marital status: Legally Separated    Spouse name: Not on file   Number of children: Not on file   Years of education: Not on file   Highest education level: Not on file  Occupational History   Not on file  Tobacco Use   Smoking status: Every Day    Packs/day: 0.25    Years: 30.00    Pack years: 7.50    Types: Cigarettes   Smokeless tobacco:  Never  Vaping Use   Vaping Use: Never used  Substance and Sexual Activity   Alcohol use: Yes    Comment: 1-2 glasses of liquor 2 times per month   Drug use: Yes    Types: Marijuana    Comment: last use 07/2021   Sexual activity: Not on file  Other Topics Concern   Not on file  Social History Narrative   Not on file   Social Determinants of Health   Financial Resource Strain: High Risk   Difficulty of Paying Living Expenses: Very hard  Food Insecurity: No Food Insecurity   Worried About Charity fundraiser in the Last Year: Never true   Ran Out of Food in the Last Year: Never true  Transportation Needs: No Transportation Needs   Lack of Transportation (Medical): No   Lack of Transportation (Non-Medical): No  Physical Activity: Inactive   Days of Exercise per Week: 0 days   Minutes of Exercise per Session: 0 min  Stress: Stress Concern Present   Feeling of Stress : Very much  Social Connections: Socially Isolated   Frequency of Communication with Friends and Family: More than three times a week   Frequency of Social Gatherings with Friends and Family: More than three times a week   Attends Religious Services: Never   Marine scientist or Organizations: No  Attends Archivist Meetings: Never   Marital Status: Divorced  Human resources officer Violence: Not At Risk   Fear of Current or Ex-Partner: No   Emotionally Abused: No   Physically Abused: No   Sexually Abused: No    Outpatient Medications Prior to Visit  Medication Sig Dispense Refill   ibuprofen (ADVIL) 200 MG tablet Take 200 mg by mouth every 6 (six) hours as needed.     No facility-administered medications prior to visit.    Allergies  Allergen Reactions   Gabapentin Palpitations and Other (See Comments)    convulsions    ROS Review of Systems    Objective:    Physical Exam  There were no vitals taken for this visit. Wt Readings from Last 3 Encounters:  09/15/21 (!) 332 lb 14.4 oz (151  kg)  04/13/21 (!) 323 lb 6.4 oz (146.7 kg)  05/29/18 278 lb (126.1 kg)     Health Maintenance Due  Topic Date Due   COVID-19 Vaccine (1) Never done   HIV Screening  Never done   Hepatitis C Screening  Never done   TETANUS/TDAP  Never done   COLONOSCOPY (Pts 45-53yr Insurance coverage will need to be confirmed)  Never done   Zoster Vaccines- Shingrix (1 of 2) Never done   INFLUENZA VACCINE  Never done    There are no preventive care reminders to display for this patient.  Lab Results  Component Value Date   TSH 1.230 09/15/2021   Lab Results  Component Value Date   WBC 8.3 09/15/2021   HGB 16.2 09/15/2021   HCT 47.5 09/15/2021   MCV 93 09/15/2021   PLT 302 09/15/2021   Lab Results  Component Value Date   NA 141 09/15/2021   K 4.6 09/15/2021   CO2 23 09/15/2021   GLUCOSE 113 (H) 09/15/2021   BUN 19 09/15/2021   CREATININE 0.89 09/15/2021   BILITOT <0.2 09/15/2021   ALKPHOS 78 09/15/2021   AST 16 09/15/2021   ALT 27 09/15/2021   PROT 6.8 09/15/2021   ALBUMIN 4.2 09/15/2021   CALCIUM 9.3 09/15/2021   ANIONGAP 13 05/19/2021   EGFR 103 09/15/2021   Lab Results  Component Value Date   CHOL 206 (H) 09/15/2021   Lab Results  Component Value Date   HDL 47 09/15/2021   Lab Results  Component Value Date   LDLCALC 131 (H) 09/15/2021   Lab Results  Component Value Date   TRIG 157 (H) 09/15/2021   Lab Results  Component Value Date   CHOLHDL 4.4 09/15/2021   No results found for: HGBA1C    Assessment & Plan:   Problem List Items Addressed This Visit       Musculoskeletal and Integument   Arthritis - Primary     Other   Health care maintenance    1. Arthritis Review of his comprehensive labs from 09/15/2021 detected a positive ANA, a mildly elevated sedimentation rate and some proteinuria. Other comprehensive labs, non fasting, were normal. Patient appears to be, by history, significantly inhibited in his ambulation due to his arthritis, apparently  degenerative in nature. Patient self medicates with non steroidal anti inflammatory medications. In discussions today he told [place] trying to get some disability due to this arthritic condition.   Due to his arthritic biomarkers I plan to refer him to our volunteer rheumatologist for review and assessment.     Follow-up: Return in about 24 weeks (around 03/09/2022).    GDede Query  CMA
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
## 2021-09-28 ENCOUNTER — Ambulatory Visit: Payer: Medicaid Other | Admitting: Licensed Clinical Social Worker

## 2021-09-30 ENCOUNTER — Ambulatory Visit: Payer: Medicaid Other | Admitting: Licensed Clinical Social Worker

## 2021-10-04 ENCOUNTER — Telehealth: Payer: Medicaid Other

## 2021-10-04 NOTE — Chronic Care Management (AMB) (Signed)
Care Management Clinical Social Work Note  10/04/2021 Name: [name] MRN: [id number] DOB: [birth year]  Sean Blake is a 51 y.o. year old male who is a primary care patient of No primary care provider on file..  The Care Management team was consulted for assistance with chronic disease management and coordination needs.  Engaged with patient by telephone for follow up visit in response to provider referral for social work chronic care management and care coordination services  Consent to Services:  Mr. Gullo was given information about Care Management services today including:  Care Management services includes personalized support from designated clinical staff supervised by his physician, including individualized plan of care and coordination with other care providers 24/7 contact phone numbers for assistance for urgent and routine care needs. The patient may stop case management services at any time by phone call to the office staff.  Patient agreed to services and consent obtained.   Summary:  Patient continues to maintain positive progress with care plan goals. He has provided permission to submit completed application for CAFA.  See Care Plan below for interventions and patient self-care activities.  Recommendation: Patient may benefit from, and is in agreement work with LCSW to address care coordination needs and will continue to work with the clinical team to address health care and disease management related needs.   Follow up Plan: Patient would like continued follow-up from CCM LCSW.  per patient's request will follow up in 10/26/21.  Will call office if needed prior to next encounter.  SDOH (Social Determinants of Health) assessments and interventions performed:    Advanced Directives Status: Not addressed in this encounter.  Care Plan  Allergies  Allergen Reactions   Gabapentin Palpitations and Other (See Comments)    convulsions    Outpatient Encounter  Medications as of 09/28/2021  Medication Sig Note   ibuprofen (ADVIL) 200 MG tablet Take 200 mg by mouth every 6 (six) hours as needed. 04/19/2021: Reports taking 2 tablets (400 mg) twice daily as needed   No facility-administered encounter medications on file as of 09/28/2021.    Patient Active Problem List   Diagnosis Date Noted   Health care maintenance 09/22/2021   Mass of soft tissue of neck 09/15/2021   Arthritis 09/15/2021   Anxiety and depression 09/15/2021   Obesity 09/15/2021    Conditions to be addressed/monitored: Anxiety, Depression, and Arthritis ; Financial constraints related to inability to afford insurance  Care Plan : LCSW Plan of Care  Updates made by [name], LCSW since 10/04/2021 12:00 AM     Problem: Barriers to Treatment      Long-Range Goal: Barriers to Treatment Identified and Managed   Start Date: 04/27/2021  This Visit's Progress: On track  Recent Progress: On track  Priority: High  Note:   Current barriers:   Financial strain, Limited social support, Housing barriers, and Mental Health Concerns  Needs Support, Education, and Care Coordination in order to meet unmet mental health needs. Clinical Goal(s): demonstrate a reduction in symptoms related to :Anxiety  and Depression  explore community resource options for unmet needs related OZ:HYQMVHQIO constraints and No health insurance    Clinical Interventions:  Assessed patient's previous and current treatment, coping skills, support system and barriers to care  Patient reports stress triggered by upcoming court date scheduled 05/26/21 regarding a case from a couple of years ago. Patient agreed to contact the DA to request assistance in dismissing the charges. Patient reports that he is working  on a way to travel to [facility] where the case is pending Patient reports that he is interested in establishing counseling to address symptoms of depression and anxiety triggered by extensive trauma and  grief. CCM LCSW discussed local agencies that provide counseling and medication management. Resources will be provided via e-mail for future reference 09/27: Patient prefers to obtain psychiatric and/or counseling through [facility]. Prefers to complete CAFA application and have PCP complete referral to [facility] 11/7: Patient reports feelings of anxiety. He has been residing with a friend; however, they need him to leave in the next two weeks. Patient reports difficulty managing feelings of sadness, irritability, and being around others I go into a cold sweat Denies SI/HI 12/07: Patient is currently residing with step-daughter, which offers him improved accessibility to PCP office  Patient has provided disability CM consent to review medical bills. He has not heard from them about the appeal. Patient currently does not have any income.CCM LCSW provided patient with local emergency shelters. Patient reports that he is unable to being around people 12/07: Patient has an upcoming appt with disability on 09/08/21 Patient reports difficulty affording his medications. CCM LCSW provided information on [facility] Patient will submit application to [facility] when state I.D. arrives 09/13: Patient reports that his state ID came in the mail and he will submit application this week 00/37: Patient reports that he has been without a vehicle for approximately one week. During this time, he has been unable to submit application to [facility] or drop off CAFA to PCP office. Patient agreed to mail application and supportive documentation to PCP office within this week 11/7: Patient agreed to submit application on 04/88/89 12/07: Patient reports completion of application. Needs only to submit 1/31: Patient reports interest in obtaining management when his pain symptoms are managed Patient shared that he is feeling better after recent ED visit. He currently has money to pick  up his meds. CCM LCSW informed him that they are still available and verified patient's pharmacy CCM LCSW reviewed upcoming appointments Patient reports that he received CAFA application via e-mail 16/94: CCM LCSW mailed a print out of CAFA application, per patient request. CCM LCSW reviewed required documents associated with application 50/38: Patient has provided CAFA application at PCP office for LCSW to review. He is waiting to receive FS Award Letter, to add to application 88/28: CCM LCSW commended patient for submitting paperwork for LCSW to review. Additional documents needed and LCSW will mail Letter of Support for pt to complete 1:31: Patient reports establishing with [facility]; however, shared the prescription medication (Ibuprofen) does not assist in management for arthritis. Patient will like to submit application to [facility], to assist with specialist referrals Patient has completed intake for Disability Claim. States he has not heard back from [facility]. Patient agreed to obtain contact information and provide to CCM LCSW to strengthen support regarding next steps CCM LCSW discussed benefits of obtaining assistance from Legal Aid to assist with renewal of SNAP benefits. LCSW will complete Legal Aid Referral CCM LCSW collaborated with [facility] regarding plan of care for patient. RNCM has completed a Care Guide referral to assist with housing/financial strain Review various resources, discussed options and provided patient information about Various insurance options ( [employer] Card, [employer]  and [place]) Depression screen reviewed , Solution-Focused Strategies, Mindfulness or Psychologist, educational, Active listening / Reflection utilized , Emotional Supportive Provided, Provided psychoeducation for  mental health needs , Verbalization of feelings encouraged , and Crisis Resource Education / information provided   1:1 collaboration with  primary care provider regarding development and update of comprehensive plan of care as evidenced by provider attestation and co-signature Inter-disciplinary care team collaboration (see longitudinal plan of care) Patient Goals/Self-Care Activities: Over the next 120 days Participation in mental health treatment encouraged Strategies to manage emotional triggers promoted Practice positive thinking and self-talk Follow up on insurance options (CAFA) Contact office with any questions or concerns Attend all scheduled appointments with providers     [name], MSW, [email or web address] Phone [phone] 5:35 AM

## 2021-10-04 NOTE — Patient Instructions (Signed)
Visit Information  Thank you for taking time to visit with me today. Please don't hesitate to contact me if I can be of assistance to you before our next scheduled telephone appointment.  Following are the goals we discussed today:  Patient Goals/Self-Care Activities: Over the next 120 days Participation in mental health treatment encouraged Strategies to manage emotional triggers promoted Practice positive thinking and self-talk Follow up on insurance options (CAFA) Contact office with any questions or concerns Attend all scheduled appointments with providers  Our next appointment is by telephone on 10/26/21 at 10:45 AM  Please call the care guide team at [phone] if you need to cancel or reschedule your appointment.   If you are experiencing a Mental Health or [place] or need someone to talk to, please call the [place] National Suicide Prevention Lifeline: 385-846-6703 or TTY: 212-712-3394 TTY 501-541-7877) to talk to a trained counselor call 911   Patient verbalizes understanding of instructions and care plan provided today and agrees to view in [place]. Active MyChart status confirmed with patient.    [name], MSW, [facility] Care Management [email or web address] Phone [phone] 5:37 AM

## 2021-10-05 ENCOUNTER — Ambulatory Visit: Payer: Medicaid Other | Admitting: Licensed Clinical Social Worker

## 2021-10-07 ENCOUNTER — Ambulatory Visit: Payer: Medicaid Other | Admitting: Licensed Clinical Social Worker

## 2021-10-07 ENCOUNTER — Telehealth: Payer: Self-pay | Admitting: Licensed Clinical Social Worker

## 2021-10-07 NOTE — Telephone Encounter (Signed)
Called the patient twice during today's scheduled appointment; no answer, left a voicemail with the clinic contact information so they may reschedule.   

## 2021-10-12 ENCOUNTER — Ambulatory Visit: Payer: Self-pay | Admitting: Licensed Clinical Social Worker

## 2021-10-12 ENCOUNTER — Other Ambulatory Visit: Payer: Self-pay

## 2021-10-12 ENCOUNTER — Ambulatory Visit: Payer: Medicaid Other | Admitting: Licensed Clinical Social Worker

## 2021-10-12 DIAGNOSIS — F411 Generalized anxiety disorder: Secondary | ICD-10-CM

## 2021-10-12 DIAGNOSIS — F331 Major depressive disorder, recurrent, moderate: Secondary | ICD-10-CM
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
## 2021-10-19 ENCOUNTER — Other Ambulatory Visit: Payer: Self-pay

## 2021-10-19 ENCOUNTER — Other Ambulatory Visit: Payer: Self-pay | Admitting: Gerontology

## 2021-10-19 ENCOUNTER — Ambulatory Visit: Payer: Medicaid Other | Admitting: Rheumatology

## 2021-10-19 DIAGNOSIS — F419 Anxiety disorder, unspecified: Secondary | ICD-10-CM

## 2021-10-19 MED ORDER — BUPROPION HCL ER (SR) 150 MG PO TB12
150.0000 mg | ORAL_TABLET | Freq: Every day | ORAL | 0 refills | Status: DC
Start: 2021-10-19 — End: 2022-03-09
  Filled 2021-10-19: qty 30, 30d supply, fill #0

## 2021-10-20 ENCOUNTER — Other Ambulatory Visit: Payer: Self-pay

## 2021-10-20 ENCOUNTER — Ambulatory Visit: Payer: Self-pay | Admitting: Licensed Clinical Social Worker

## 2021-10-20 ENCOUNTER — Ambulatory Visit: Payer: Medicaid Other | Admitting: Licensed Clinical Social Worker

## 2021-10-20 DIAGNOSIS — F331 Major depressive disorder, recurrent, moderate: Secondary | ICD-10-CM

## 2021-10-20 DIAGNOSIS — F411 Generalized anxiety disorder: Secondary | ICD-10-CM

## 2021-10-20 NOTE — BH Specialist Note (Signed)
Integrated Behavioral Health via Telemedicine Visit  10/20/2021 Sean Blake [id number]    Referring Provider: Andrey Farmer MD  Patient/Family location: The patient's home  [facility] Provider location: [facility] [place]  All persons participating in visit: Pankaj Haack. Coralyn Mark and Lesli Albee,  MSW, LCSW-A Types of Service: Telephone visit  I connected with Sean Blake  via  Telephone or Video Enabled Telemedicine Application  (Video is Caregility application) and verified that I am speaking with the correct person using two identifiers. Discussed confidentiality: Yes   I discussed the limitations of telemedicine and the availability of in person appointments.  Discussed there is a possibility of technology failure and discussed alternative modes of communication if that failure occurs.  Patient and/or legal guardian expressed understanding and consented to Telemedicine visit: Yes   Presenting Concerns: Patient and/or family reports the following symptoms/concerns: The patient reports that he has been doing about the same since his last follow-up visit. He shared that he learned his friend passed away this past weekend. He explained that he felt depressed and down for a couple of days after the news and thought about being better off dead himself. The patient denied any plan to end his life or access to means to end his life. Kabel shared that these thoughts passed quickly over the weekend and have not returned. He stated, "I do not want to kill myself because I have a family even though I feel lonely, sometimes I do have them, and I have grandchildren, and I don't want to hurt anyone." Trombetta stated he would tell his stepdaughter, whom he is living with, and go to [facility] or the Emergency Department if his suicidal thoughts worsened. Marucci discussed financial and health stressors impacting his current life. The patient shared that he struggles with distressing, intrusive thoughts  regarding past confrontations with others. The patient reported that he had not picked up his prescription for Wellbutrin yet due to transportation issues. He explained he will pick it up this week. Honse noted that he was looking forward to spending time with his grandson this week.  The patient denied any homicidal thoughts; however, he endorsed recent passive suicidal ideations.  Duration of problem: Years; Severity of problem: moderate  Patient and/or Family's Strengths/Protective Factors: Concrete supports in place (healthy food, safe environments, etc.)  Goals Addressed: Patient will:  Reduce symptoms of: agitation, anxiety, depression, insomnia, and stress   Increase knowledge and/or ability of: coping skills, healthy habits, self-management skills, and stress reduction   Demonstrate ability to: Increase healthy adjustment to current life circumstances and Increase adequate support systems for patient/family  Progress towards Goals: Ongoing  Interventions: Interventions utilized:  CBT Cognitive Behavioral Therapy was utilized by the clinician during today's follow up session. Clinician met with patient to identify needs related to stressors and functioning, and assess and monitor for signs and symptoms of anxiety and depression, and assess safety. The clinician processed with the patient how they have been doing since the last follow-up session. Clinician measured the patient's anxiety and depression on a numerical scale. Clinician completed a risk assessment to determine level of severity when it comes to patient's suicidal thoughts. Clinician determined that the patient is competent, thinking clearly, orientated x 3, able to make own medical decisions, and is not acutely suicidal but if he were, he would access emergency crisis services. Clinician completed a safety plan with the patient and provided the patient with instructions on how to access mental health crisis  care: call 988, 911,  [facility] Access 24/7 crisis line @ [phone], call [facility] mobile crisis at [phone] 8:00 am- 12:00 am seven days a week, or go to the nearest emergency department. Clinician encouraged the patient to pick up his prescription for Wellbutrin from [facility] and utilized guidance and supervision, provided by psychiatric consultant, to inform patient of the benefits and risks of psychotropic medication use including a verbal summary of Black Box warnings. Patient was given the opportunity to ask questions and address concerns regarding the psychiatric consultants recommendations for treatment.    Standardized Assessments completed: GAD-7 and PHQ 9 PHQ-9 =  18 GAD-7 =  16  Assessment: Patient currently experiencing see above.   Patient may benefit from see above.  Plan: Follow up with behavioral health clinician on : 10/27/2021 at noon Behavioral recommendations:  Referral(s): [place] (In Clinic)  I discussed the assessment and treatment plan with the patient and/or parent/guardian. They were provided an opportunity to ask questions and all were answered. They agreed with the plan and demonstrated an understanding of the instructions.   They were advised to call back or seek an in-person evaluation if the symptoms worsen or if the condition fails to improve as anticipated.  Lesli Albee, LCSWA

## 2021-10-25 ENCOUNTER — Other Ambulatory Visit: Payer: Medicaid Other

## 2021-10-26 ENCOUNTER — Ambulatory Visit: Payer: Medicaid Other | Admitting: Pharmacy Technician

## 2021-10-26 ENCOUNTER — Other Ambulatory Visit: Payer: Self-pay

## 2021-10-26 ENCOUNTER — Ambulatory Visit: Payer: Medicaid Other | Admitting: Licensed Clinical Social Worker

## 2021-10-26 DIAGNOSIS — Z79899 Other long term (current) drug therapy: Secondary | ICD-10-CM

## 2021-10-26 NOTE — Progress Notes (Signed)
Completed [facility] application and contract.  Patient agreed to all terms of the [facility] contract.    Patient approved to receive medication assistance at [facility] until time for re-certification in 7846, and as long as eligibility criteria continues to be met.    Provided patient with Civil engineer, contracting based on his particular needs.    [facility]

## 2021-10-27 ENCOUNTER — Encounter: Payer: Medicaid Other | Admitting: Pharmacist

## 2021-10-27 ENCOUNTER — Telehealth: Payer: Self-pay | Admitting: Licensed Clinical Social Worker

## 2021-10-27 ENCOUNTER — Other Ambulatory Visit: Payer: Self-pay

## 2021-10-27 ENCOUNTER — Ambulatory Visit: Payer: Medicaid Other | Admitting: Licensed Clinical Social Worker

## 2021-10-27 NOTE — Telephone Encounter (Signed)
Called the patient twice during today's scheduled appointment; no answer, left a voicemail with the clinic contact information so they may reschedule.   

## 2021-10-29 NOTE — Chronic Care Management (AMB) (Signed)
Care Management Clinical Social Work Note  10/29/2021 Name: BEARL TALARICO MRN: [id number] DOB: 1971-03-27  Orlan Leavens Piccione is a 51 y.o. year old male who is a primary care patient of No primary care provider on file..  The Care Management team was consulted for assistance with chronic disease management and coordination needs.  Engaged with patient by email for follow up visit in response to provider referral for social work chronic care management and care coordination services  Consent to Services:  Mr. Shanholtzer was given information about Care Management services today including:  Care Management services includes personalized support from designated clinical staff supervised by his physician, including individualized plan of care and coordination with other care providers 24/7 contact phone numbers for assistance for urgent and routine care needs. The patient may stop case management services at any time by phone call to the office staff.  Patient agreed to services and consent obtained.   Assessment: Review of patient past medical history, allergies, medications, and health status, including review of relevant consultants reports was performed today as part of a comprehensive evaluation and provision of chronic care management and care coordination services.  SDOH (Social Determinants of Health) assessments and interventions performed:    Advanced Directives Status: Not addressed in this encounter.  Care Plan  Allergies  Allergen Reactions   Gabapentin Palpitations and Other (See Comments)    convulsions    Outpatient Encounter Medications as of 10/26/2021  Medication Sig Note   buPROPion (WELLBUTRIN SR) 150 MG 12 hr tablet Take 1 tablet (150 mg total) by mouth once daily.    ibuprofen (ADVIL) 200 MG tablet Take 200 mg by mouth every 6 (six) hours as needed. 04/19/2021: Reports taking 2 tablets (400 mg) twice daily as needed   No facility-administered encounter medications on file  as of 10/26/2021.    Patient Active Problem List   Diagnosis Date Noted   Health care maintenance 09/22/2021   Mass of soft tissue of neck 09/15/2021   Arthritis 09/15/2021   Anxiety and depression 09/15/2021   Obesity 09/15/2021    Conditions to be addressed/monitored: Anxiety and Depression; Financial constraints related to obtaining medical coverage  Care Plan : LCSW Plan of Care  Updates made by [name], LCSW since 10/29/2021 12:00 AM     Problem: Barriers to Treatment      Long-Range Goal: Barriers to Treatment Identified and Managed   Start Date: 04/27/2021  This Visit's Progress: On track  Recent Progress: On track  Priority: High  Note:   Current barriers:   Financial strain, Limited social support, Housing barriers, and Mental Health Concerns  Needs Support, Education, and Care Coordination in order to meet unmet mental health needs. Clinical Goal(s): demonstrate a reduction in symptoms related to :Anxiety  and Depression  explore community resource options for unmet needs related PO:EUMPNTIRW constraints and No health insurance    Clinical Interventions:  Assessed patient's previous and current treatment, coping skills, support system and barriers to care  Patient reports stress triggered by upcoming court date scheduled 05/26/21 regarding a case from a couple of years ago. Patient agreed to contact the DA to request assistance in dismissing the charges. Patient reports that he is working on a way to travel to [facility] where the case is pending Patient reports that he is interested in establishing counseling to address symptoms of depression and anxiety triggered by extensive trauma and grief. CCM LCSW discussed local agencies that provide counseling and medication management.  Resources will be provided via e-mail for future reference 09/27: Patient prefers to obtain psychiatric and/or counseling through [facility]. Prefers to complete CAFA application and  have PCP complete referral to [facility] 11/7: Patient reports feelings of anxiety. He has been residing with a friend; however, they need him to leave in the next two weeks. Patient reports difficulty managing feelings of sadness, irritability, and being around others I go into a cold sweat Denies SI/HI 12/07: Patient is currently residing with step-daughter, which offers him improved accessibility to PCP office  Patient has provided disability CM consent to review medical bills. He has not heard from them about the appeal. Patient currently does not have any income.CCM LCSW provided patient with local emergency shelters. Patient reports that he is unable to being around people 12/07: Patient has an upcoming appt with disability on 09/08/21 Patient reports difficulty affording his medications. CCM LCSW provided information on [facility] Patient will submit application to [facility] when state I.D. arrives 09/13: Patient reports that his state ID came in the mail and he will submit application this week 76/28: Patient reports that he has been without a vehicle for approximately one week. During this time, he has been unable to submit application to [facility] or drop off CAFA to PCP office. Patient agreed to mail application and supportive documentation to PCP office within this week 11/7: Patient agreed to submit application on 31/51/76 12/07: Patient reports completion of application. Needs only to submit 1/31: Patient reports interest in obtaining management when his pain symptoms are managed Patient shared that he is feeling better after recent ED visit. He currently has money to pick up his meds. CCM LCSW informed him that they are still available and verified patient's pharmacy CCM LCSW reviewed upcoming appointments Patient reports that he received CAFA application via e-mail 16/07: CCM LCSW mailed a print out of CAFA application, per patient request.  CCM LCSW reviewed required documents associated with application 37/10: Patient has provided CAFA application at PCP office for LCSW to review. He is waiting to receive FS Award Letter, to add to application 62/69: CCM LCSW commended patient for submitting paperwork for LCSW to review. Additional documents needed and LCSW will mail Letter of Support for pt to complete 1:31: Patient reports establishing with [facility]; however, shared the prescription medication (Ibuprofen) does not assist in management for arthritis. Patient will like to submit application to [facility], to assist with specialist referrals 2/28: Patient provided bank statements and CCM LCSW submitted completed CAFA application via mail Patient has completed intake for Disability Claim. States he has not heard back from [place]. Patient agreed to obtain contact information and provide to CCM LCSW to strengthen support regarding next steps CCM LCSW discussed benefits of obtaining assistance from Legal Aid to assist with renewal of SNAP benefits. LCSW will complete Legal Aid Referral CCM LCSW collaborated with [facility] regarding plan of care for patient. RNCM has completed a Care Guide referral to assist with housing/financial strain Review various resources, discussed options and provided patient information about Various insurance options ( [employer], [place] [employer] [place]) Depression screen reviewed , Solution-Focused Strategies, Mindfulness or Psychologist, educational, Active listening / Reflection utilized , Emotional Supportive Provided, Provided psychoeducation for mental health needs , Verbalization of feelings encouraged , and Research officer, political party / information provided   1:1 collaboration with primary care provider regarding development and update of comprehensive plan of  care as evidenced by provider attestation and co-signature Inter-disciplinary care team collaboration  (see longitudinal plan of care) Patient Goals/Self-Care Activities: Over the next 120 days Participation in mental health treatment encouraged Strategies to manage emotional triggers promoted Practice positive thinking and self-talk Follow up on insurance options (CAFA) Contact office with any questions or concerns Attend all scheduled appointments with providers      [name], MSW, [email or web address] Phone [phone] 11:00 AM

## 2021-10-29 NOTE — Patient Instructions (Signed)
Visit Information  Thank you for taking time to visit with me today. Please don't hesitate to contact me if I can be of assistance to you before our next scheduled telephone appointment.  Following are the goals we discussed today:  Patient Goals/Self-Care Activities: Over the next 120 days Participation in mental health treatment encouraged Strategies to manage emotional triggers promoted Practice positive thinking and self-talk Follow up on insurance options (CAFA) Contact office with any questions or concerns Attend all scheduled appointments with providers  Our next appointment is by telephone on 12/07/21 at 11:30 AM  Please call the care guide team at [phone] if you need to cancel or reschedule your appointment.   If you are experiencing a Mental Health or [place] or need someone to talk to, please call the Suicide and Crisis Lifeline: 988   Patient verbalizes understanding of instructions and care plan provided today and agrees to view in [place]. Active MyChart status confirmed with patient.    [name], MSW, [facility] Care Management [email or web address] Phone [phone] 11:01 AM

## 2021-11-11 ENCOUNTER — Telehealth: Payer: Self-pay | Admitting: Licensed Clinical Social Worker

## 2021-11-11 ENCOUNTER — Ambulatory Visit: Payer: Medicaid Other | Admitting: Licensed Clinical Social Worker

## 2021-11-11 NOTE — Telephone Encounter (Signed)
Called the patient twice during today's scheduled appointment; no answer, left a voicemail with the clinic contact information so they may reschedule.   

## 2021-12-01 ENCOUNTER — Telehealth: Payer: Self-pay | Admitting: Licensed Clinical Social Worker

## 2021-12-01 NOTE — Telephone Encounter (Signed)
-----   Message from [place] sent at 11/30/2021 12:10 PM EDT ----- ?Regarding: Appt ?Good Day,  ? ?Please call pt and reschedule his missed [facility] appointment and make a telephone note of the attempt.  ? ?Thank You ? ?

## 2021-12-01 NOTE — Telephone Encounter (Signed)
Spoke w/ patient and made appointment for 4/12 at 10 am ?

## 2021-12-07 ENCOUNTER — Ambulatory Visit: Payer: Medicaid Other | Admitting: Licensed Clinical Social Worker

## 2021-12-07 NOTE — Chronic Care Management (AMB) (Signed)
Care Management ?Clinical Social Work Note ? ?12/07/2021 ?Name: NORM WRAY MRN: [id number] DOB: 09-Dec-1970 ? ?DOW BLAHNIK is a 51 y.o. year old male who is a primary care patient of No primary care provider on file..  The Care Management team was consulted for assistance with chronic disease management and coordination needs. ? ?Engaged with patient by telephone for follow up visit in response to provider referral for social work chronic care management and care coordination services ? ?Consent to Services:  ?Mr. Winchell was given information about Care Management services today including:  ?Care Management services includes personalized support from designated clinical staff supervised by his physician, including individualized plan of care and coordination with other care providers ?24/7 contact phone numbers for assistance for urgent and routine care needs. ?The patient may stop case management services at any time by phone call to the office staff. ? ?Patient agreed to services and consent obtained.  ? ?Assessment: Review of patient past medical history, allergies, medications, and health status, including review of relevant consultants reports was performed today as part of a comprehensive evaluation and provision of chronic care management and care coordination services. ? ?SDOH (Social Determinants of Health) assessments and interventions performed:   ? ?Advanced Directives Status: Not addressed in this encounter. ? ?Care Plan ? ?Allergies  ?Allergen Reactions  ? Gabapentin Palpitations and Other (See Comments)  ?  convulsions  ? ? ?Outpatient Encounter Medications as of 12/07/2021  ?Medication Sig Note  ? buPROPion (WELLBUTRIN SR) 150 MG 12 hr tablet Take 1 tablet (150 mg total) by mouth once daily.   ? ibuprofen (ADVIL) 200 MG tablet Take 200 mg by mouth every 6 (six) hours as needed. 04/19/2021: Reports taking 2 tablets (400 mg) twice daily as needed  ? ?No facility-administered encounter medications on  file as of 12/07/2021.  ? ? ?Patient Active Problem List  ? Diagnosis Date Noted  ? Health care maintenance 09/22/2021  ? Mass of soft tissue of neck 09/15/2021  ? Arthritis 09/15/2021  ? Anxiety and depression 09/15/2021  ? Obesity 09/15/2021  ? ? ?Conditions to be addressed/monitored: Anxiety and Depression; Financial constraints related to obtaining medical coverage ? ?Care Plan : LCSW Plan of Care  ?Updates made by [name], LCSW since 12/07/2021 12:00 AM  ?  ? ?Problem: Barriers to Treatment   ?  ? ?Long-Range Goal: Barriers to Treatment Identified and Managed   ?Start Date: 04/27/2021  ?Expected End Date: 02/25/2022  ?This Visit's Progress: On track  ?Recent Progress: On track  ?Priority: High  ?Note:   ?Current barriers:   ?Museum/gallery curator strain, Limited social support, Housing barriers, and Mental Health Concerns  ?Needs Support, Education, and Care Coordination in order to meet unmet mental health needs. ?Clinical Goal(s): demonstrate a reduction in symptoms related to :Anxiety  and Depression  ?explore community resource options for unmet needs related WL:[id number] constraints and No health insurance    ?Clinical Interventions:  ?Assessed patient's previous and current treatment, coping skills, support system and barriers to care  ?Patient reports stress triggered by upcoming court date scheduled 05/26/21 regarding a case from a couple of years ago. Patient agreed to contact the DA to request assistance in dismissing the charges. Patient reports that he is working on a way to travel to [facility] where the case is pending ?Patient reports that he is interested in establishing counseling to address symptoms of depression and anxiety triggered by extensive trauma and grief. CCM LCSW discussed local agencies that  provide counseling and medication management. Resources will be provided via e-mail for future reference 09/27: Patient prefers to obtain psychiatric and/or counseling through [facility].  Prefers to complete CAFA application and have PCP complete referral to [facility] 11/7: Patient reports feelings of anxiety. He has been residing with a friend; however, they need him to leave in the next two weeks. Patient reports difficulty managing feelings of sadness, irritability, and being around others ?I go into a cold sweat? Denies SI/HI 12/07: Patient is currently residing with step-daughter, which offers him improved accessibility to PCP office  ?Patient has provided disability CM consent to review medical bills. He has not heard from them about the appeal. Patient currently does not have any income.CCM LCSW provided patient with local emergency shelters. Patient reports that he is unable to being around people 12/07: Patient has an upcoming appt with disability on 09/08/21 ?Patient reports difficulty affording his medications. CCM LCSW provided information on [facility] ?Patient will submit application to [facility] when state I.D. arrives 09/13: Patient reports that his state ID came in the mail and he will submit application this week 26/94: Patient reports that he has been without a vehicle for approximately one week. During this time, he has been unable to submit application to [facility] or drop off CAFA to PCP office. Patient agreed to mail application and supportive documentation to PCP office within this week 11/7: Patient agreed to submit application on 85/46/27 12/07: Patient reports completion of application. Needs only to submit 1/31: Patient reports interest in obtaining management when his pain symptoms are managed 4/11: Patient reports that [facility] unable to meet his medical needs and wishes to have [facility] as his PCP home ?Patient shared that he is feeling better after recent ED visit. He currently has money to pick up his meds. CCM LCSW informed him that they are still available and verified patient's pharmacy ?CCM LCSW reviewed  upcoming appointments ?Patient reports that he received CAFA application via e-mail 03/50: CCM LCSW mailed a print out of CAFA application, per patient request. CCM LCSW reviewed required documents associated with application 09/38: Patient has provided CAFA application at PCP office for LCSW to review. He is waiting to receive FS Award Letter, to add to application 18/29: CCM LCSW commended patient for submitting paperwork for LCSW to review. Additional documents needed and LCSW will mail Letter of Support for pt to complete 1:31: Patient reports establishing with [facility]; however, shared the prescription medication (Ibuprofen) does not assist in management for arthritis. Patient will like to submit application to [facility], to assist with specialist referrals 2/28: Patient provided bank statements and CCM LCSW submitted completed CAFA application via mail ?Patient has completed intake for Disability Claim. States he has not heard back from [place]. Patient agreed to obtain contact information and provide to CCM LCSW to strengthen support regarding next steps 4/11: Patient received request from Financial Dept for add'l information regarding SNAP benefits. Pt has obtained ppwk and agreed to drop off at [facility], to ensure documentation is submitted by 4/15, as requested ?CCM LCSW discussed benefits of obtaining assistance from Legal Aid to assist with renewal of SNAP benefits. LCSW will complete Legal Aid Referral ?CCM LCSW collaborated with [facility] regarding plan of care for patient. RNCM has completed a Care Guide referral to assist with housing/financial strain ?Review various resources, discussed options and provided patient information about Various insurance options [employer],  [facility] Financial Assistance and [place]) ?Depression screen reviewed , Solution-Focused Strategies, Mindfulness or Relaxation Training, Active listening / Reflection utilized ,  Emotional Supportive Provided, Provided psychoeducation for mental health needs , Verbalization of feelings encouraged , and Crisis Resource Education / information provided   ?1:1 collaboration with primary care pr

## 2021-12-07 NOTE — Patient Instructions (Signed)
Visit Information ? ?Thank you for taking time to visit with me today. Please don't hesitate to contact me if I can be of assistance to you before our next scheduled telephone appointment. ? ?Following are the goals we discussed today:  ?Patient Goals/Self-Care Activities: Over the next 120 days ?Participation in mental health treatment encouraged ?Strategies to manage emotional triggers promoted ?Practice positive thinking and self-talk ?Follow up on insurance options ([employer]) ?Contact office with any questions or concerns ?Attend all scheduled appointments with providers ? ?Our next appointment is by telephone on 01/18/22 at 1:00 PM ? ?Please call the care guide team at [phone] if you need to cancel or reschedule your appointment.  ? ?If you are experiencing a Mental Health or [place] or need someone to talk to, please call the Suicide and Crisis Lifeline: 988  ? ?Patient verbalizes understanding of instructions and care plan provided today and agrees to view in [place]. Active MyChart status confirmed with patient.   ? ?[name], MSW, LCSW ?[facility] Care Management ?[facility] ?[email or web address] ?Phone [phone] ?1:18 PM ?  ?

## 2021-12-08 ENCOUNTER — Ambulatory Visit: Payer: Self-pay | Admitting: Licensed Clinical Social Worker

## 2021-12-08 ENCOUNTER — Ambulatory Visit: Payer: Medicaid Other | Admitting: Licensed Clinical Social Worker

## 2021-12-08 DIAGNOSIS — F4310 Post-traumatic stress disorder, unspecified: Secondary | ICD-10-CM

## 2021-12-08 DIAGNOSIS — F331 Major depressive disorder, recurrent, moderate: Secondary | ICD-10-CM

## 2021-12-08 DIAGNOSIS — F411 Generalized anxiety disorder: Secondary | ICD-10-CM

## 2021-12-08 NOTE — BH Specialist Note (Signed)
Integrated Behavioral Health via Telemedicine Visit  12/08/2021 Sean Blake [id number]    Referring Provider: Carlyon Shadow, NP  Patient/Family location: The patient's daughter's home [facility] Provider location: [facility] [place] All persons participating in visit: Crixus Mcaulay. Coralyn Mark and [employer], LCSW-A Types of Service: Telephone visit  I connected with Sean Blake via  Telephone or [employer]  (Video is Tree surgeon) and verified that I am speaking with the correct person using two identifiers. Discussed confidentiality: Yes   I discussed the limitations of telemedicine and the availability of in person appointments.  Discussed there is a possibility of technology failure and discussed alternative modes of communication if that failure occurs.  Patient and/or legal guardian expressed understanding and consented to Telemedicine visit: Yes   Presenting Concerns: Patient and/or family reports the following symptoms/concerns: The patient reports that he has been doing okay since his last follow-up appointment. He stated that he had not taken Wellbutrin regularly and had two tablets left. He noted that he was not sure it was working. The patient shared that he would like to continue the medication and take it daily as prescribed to see if it would help him feel better. Sean Blake noted that his late son's birthday was March 31st, and he spent that day and the next three days crying in bed. Sean Blake stated that he avoided other people for about four days and had thought about ending his life. The patient noted that he did not have a plan to end his life or access to means to carry out a plan. Sean Blake denied having access to firearms in his home. The patient stated that when he had suicidal thoughts, he thought about his grandchildren and how he would not want to hurt them. The patient reported understanding how to access crisis services  and knew he could go to the closest emergency room or call 911 or [phone] to get help if his suicidal thoughts worsened. Further, Sean Blake noted he would tell his adult children if his suicidal thoughts worsened.   Duration of problem: Years; Severity of problem: moderate  Patient and/or Family's Strengths/Protective Factors: Concrete supports in place (healthy food, safe environments, etc.)  Goals Addressed: Patient will:  Reduce symptoms of: agitation, anxiety, depression, insomnia, and stress   Increase knowledge and/or ability of: coping skills, healthy habits, self-management skills, and stress reduction   Demonstrate ability to: Increase healthy adjustment to current life circumstances, Increase adequate support systems for patient/family, and Increase motivation to adhere to plan of care  Progress towards Goals: Ongoing  Interventions: Interventions utilized:  CBT Cognitive Behavioral Therapywas utilized by the clinician during today's follow up session. Clinician met with patient to identify needs related to stressors and functioning, and assess and monitor for signs and symptoms of anxiety and depression, and assess safety. The clinician processed with the patient how they have been doing since the last follow-up session. Clinician measured the patient's anxiety and depression on a numerical scale. Clinician completed a risk assessment to determine level of severity when it comes to patient's suicidal thoughts. The Clinician determined that the patient is competent, thinking clearly, orientated x 3, able to make medical decisions, and is not acutely suicidal. Still, if he experienced worsening symptoms, the Clinician informed him on how to access emergency crisis services and worked with the patient to develop a safety plan which included reaching out to his adult children and accessing crisis resources.    Standardized Assessments completed: GAD-7  and PHQ 9 GAD-7= 14 PHQ-9=  16  Assessment: Patient currently experiencing see above.   Patient may benefit from see above.  Plan: Follow up with behavioral health clinician on : 12/15/2021 at 10:00 Am.  Behavioral recommendations:  Referral(s): [place] (In Clinic)  I discussed the assessment and treatment plan with the patient and/or parent/guardian. They were provided an opportunity to ask questions and all were answered. They agreed with the plan and demonstrated an understanding of the instructions.   They were advised to call back or seek an in-person evaluation if the symptoms worsen or if the condition fails to improve as anticipated.  Lesli Albee, LCSWA

## 2021-12-15 ENCOUNTER — Ambulatory Visit: Payer: Medicaid Other | Admitting: Licensed Clinical Social Worker

## 2021-12-21 ENCOUNTER — Ambulatory Visit: Payer: Self-pay | Admitting: Licensed Clinical Social Worker

## 2021-12-21 ENCOUNTER — Ambulatory Visit: Payer: Medicaid Other | Admitting: Licensed Clinical Social Worker

## 2021-12-21 DIAGNOSIS — F331 Major depressive disorder, recurrent, moderate: Secondary | ICD-10-CM

## 2021-12-21 DIAGNOSIS — F411 Generalized anxiety disorder: Secondary | ICD-10-CM

## 2021-12-21 NOTE — BH Specialist Note (Signed)
Integrated Behavioral Health via Telemedicine Visit  12/21/2021 Sean Blake [id number]   Referring Provider: Carlyon Shadow, NP  Patient/Family location: The patient's home address [facility] Provider location: [facility] [place] All persons participating in visit: Sean Blake. Coralyn Mark and [employer], LCSW-A Types of Service: Telephone visit  I connected with Sean Blake via Telephone or [employer]  (Video is Tree surgeon) and verified that I am speaking with the correct person using two identifiers. Discussed confidentiality: Yes   I discussed the limitations of telemedicine and the availability of in person appointments.  Discussed there is a possibility of technology failure and discussed alternative modes of communication if that failure occurs.  Patient and/or legal guardian expressed understanding and consented to Telemedicine visit: Yes   Presenting Concerns: Patient and/or family reports the following symptoms/concerns: The patient reported that he has been doing the same since his last follow-up appointment.  Sean Blake discussed financial and situational stressors impacting his life currently.  He explained how frustrated and overwhelmed he felt dealing with Social Security disability services and navigating other systems.  The patient explained that at times he isolates himself from others either due to overwhelming feelings of grief and depression or increased irritability that at times leads to verbal outbursts.  The patient shared feelings of guilt regarding his current living arrangements.  He discussed previous relationships and feelings of being used by others.  The patient noted that he does enjoy spending time with his grandson and is looking forward to being able to start a project with him someday. Duration of problem: Years; Severity of problem: moderate  Patient and/or Family's Strengths/Protective Factors: Concrete  supports in place (healthy food, safe environments, etc.) and Sense of purpose  Goals Addressed: Patient will:  Reduce symptoms of: agitation, anxiety, depression, insomnia, and stress   Increase knowledge and/or ability of: coping skills, healthy habits, self-management skills, and stress reduction   Demonstrate ability to: Increase healthy adjustment to current life circumstances and Increase adequate support systems for patient/family  Progress towards Goals: Ongoing  Interventions: Interventions utilized:  CBT Cognitive Behavioral Therapy was utilized by the clinician during today's follow up session. Clinician met with patient to identify needs related to stressors and functioning, and assess and monitor for signs and symptoms of anxiety and depression, and assess safety. The clinician processed with the patient how they have been doing since the last follow-up session.Clinician measured the patient's anxiety and depression on a numerical scale.Clinician continued to introduced distress tolerance skills to the patient and explained that negative emotions will usually lessen in intensity and pass over time. Clinician recommended the patient switch to video-based telehealth or in-person follow-up sessions to have access to visual aids, and increase engagement in therapy; the patient stated he did not have adequate access to Internet or transportation.The clinician encouraged the patient to utilize their coping skills to deal with their current life circumstances and to reach out to crisis support services should he need them.  The session ended with scheduling. Standardized Assessments completed: GAD-7 and PHQ 9 GAD-7 = 18 PHQ-9 =  20   Assessment: Patient currently experiencing see above.   Patient may benefit from see above.  Plan: Follow up with behavioral health clinician on : 12/29/2021 at 10:00 AM  Behavioral recommendations:  Referral(s): [place] (In  Clinic)  I discussed the assessment and treatment plan with the patient and/or parent/guardian. They were provided an opportunity to ask questions and all  were answered. They agreed with the plan and demonstrated an understanding of the instructions.   They were advised to call back or seek an in-person evaluation if the symptoms worsen or if the condition fails to improve as anticipated.  Lesli Albee, LCSWA

## 2021-12-29 ENCOUNTER — Ambulatory Visit: Payer: Medicaid Other | Admitting: Licensed Clinical Social Worker

## 2021-12-29 ENCOUNTER — Ambulatory Visit: Payer: Self-pay | Admitting: Licensed Clinical Social Worker

## 2021-12-29 DIAGNOSIS — F331 Major depressive disorder, recurrent, moderate: Secondary | ICD-10-CM

## 2021-12-29 DIAGNOSIS — F411 Generalized anxiety disorder: Secondary | ICD-10-CM

## 2021-12-29 DIAGNOSIS — F4381 Prolonged grief disorder: Secondary | ICD-10-CM

## 2021-12-29 NOTE — BH Specialist Note (Signed)
Integrated Behavioral Health via Telemedicine Visit ? ?12/29/2021 ?STANFORD STRAUCH ?[id number] ? ?Referring Provider: Carlyon Shadow, NP ?Patient/Family location: The patient's address ?[facility] Provider location: [facility] [place] ?All persons participating in visit: Sean Blake and [employer], LCSW-A ?Types of Service: Telephone visit ? ?I connected with Sean Blake via Telephone or Video Enabled Telemedicine Application  (Video is Tree surgeon) and verified that I am speaking with the correct person using two identifiers. Discussed confidentiality: Yes  ? ?I discussed the limitations of telemedicine and the availability of in person appointments.  Discussed there is a possibility of technology failure and discussed alternative modes of communication if that failure occurs. ? ?Patient and/or legal guardian expressed understanding and consented to Telemedicine visit: Yes  ? ?Presenting Concerns: ?Patient and/or family reports the following symptoms/concerns:  ?The patient reports that he has been doing better since his last follow-up appointment. He noted that he is continuing to work towards getting the paperwork required by [employer] to apply for [facility]. He explained that he has gone to see his case worker at social services three times to get a food stamp verification letter, but [place] always rejects the one he receives. He discussed other situational stressors impacting his current life. He stated that he had a vivid dream about his brother and his fishing and felt this meant he would overcome an obstacle. He stated he looked up the meaning of his dream and did some reach online. He said that gave him hope that he would get his disability claim approved soon. ?  The patient discussed the loss of his son in 2020. He stated that he felt like he had died with his son. He discussed how this loss had negatively changed him from the person he was before his  son died to who he is today. He noted he isolates himself from others and cries throughout his day. He explained that he could not function or make it an entire day without thinking of his son. The patient stated he often calls his son's name out loud or starts to dial his cell number before remembering his son is gone. [name] noted that on his son's birthdays, holidays, or death anniversary, he breaks down for several days crying and experiences intense distress. The patient said that he feels he will never be whole again. [place] discussed his current transportation barriers and plans to restore a car to honour his son. The patient denied any suicidal or homicidal thoughts.  ?Duration of problem: Years; Severity of problem: moderate ? ?Patient and/or Family's Strengths/Protective Factors: ?Concrete supports in place (healthy food, safe environments, etc.) ? ?Goals Addressed: ?Patient will: ? Reduce symptoms of: agitation, anxiety, depression, insomnia, and stress  ? Increase knowledge and/or ability of: coping skills, healthy habits, self-management skills, and stress reduction  ? Demonstrate ability to: Increase healthy adjustment to current life circumstances ? ?Progress towards Goals: ?Ongoing ? ?Interventions: ?Interventions utilized:  CBT Cognitive Behavioral Therapy was utilized by the clinician during today's follow up session. Clinician met with patient to identify needs related to stressors and functioning, and assess and monitor for signs and symptoms of anxiety and depression, and assess safety. The clinician processed with the patient how they have been doing since the last follow-up session. Clinician measured the patient's anxiety and depression on a numerical scale. Clinician provided a safe judgement free space for the patient to vent his frustrations regarding his current life circumstances. Clinician encouraged the  patient to continue to work on calming techniques (eg. paced breathing, deep  muscle relaxation, and calming imagery) as a strategy for responding appropriately to anxiety and the urge to avoid situations or self-isolate when they occur and move towards increasing the patients self regulation. 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 Clinician provided the patient with Psychoeducation regarding prolonged grief disorder. Clinician recommended the patient switch to video-based telehealth or in-person follow-up sessions to have access to visual aids, and increase engagement in therapy. The session ended with scheduling.  ?Standardized Assessments completed: GAD-7 and PHQ 9 ?PHQ-9= 16 ?GAD-7= 18 ? ?Patient and/or Family Response: The patient stated that he has transportation barriers and cannot visit the clinic for his IBH follow-up appointments. In addition, he is still determining if he has a strong enough signal for telehealth sessions via his cell phone.  ? ?Assessment: ?Patient currently experiencing see above.  ? ?Patient may benefit from see above. ? ?Plan: ?Follow up with behavioral health clinician on : 01/06/2022 at 10:00 AM  ?Behavioral recommendations:  ?Referral(s): [place] (In Clinic) ? ?I discussed the assessment and treatment plan with the patient and/or parent/guardian. They were provided an opportunity to ask questions and all were answered. They agreed with the plan and demonstrated an understanding of the instructions. ?  ?They were advised to call back or seek an in-person evaluation if the symptoms worsen or if the condition fails to improve as anticipated. ? ?Lesli Albee, LCSWA ?

## 2021-12-31 ENCOUNTER — Other Ambulatory Visit: Payer: Self-pay

## 2022-01-06 ENCOUNTER — Ambulatory Visit: Payer: Medicaid Other | Admitting: Licensed Clinical Social Worker

## 2022-01-06 ENCOUNTER — Ambulatory Visit: Payer: Self-pay | Admitting: Licensed Clinical Social Worker

## 2022-01-06 DIAGNOSIS — F331 Major depressive disorder, recurrent, moderate: Secondary | ICD-10-CM

## 2022-01-06 DIAGNOSIS — F4310 Post-traumatic stress disorder, unspecified: Secondary | ICD-10-CM

## 2022-01-06 DIAGNOSIS — F411 Generalized anxiety disorder: Secondary | ICD-10-CM

## 2022-01-06 NOTE — BH Specialist Note (Signed)
Integrated Behavioral Health via Telemedicine Visit  01/06/2022 Sean Blake [id number]  Referring Provider: Carlyon Shadow,  NP Patient/Family location: The patient's home address [facility] Provider location: [facility] [place] All persons participating in visit: Peter Keyworth. Coralyn Mark and [employer], LCSW-A Types of Service: Telephone visit  I connected with Sean Blake via  Telephone or [employer]  (Video is Tree surgeon) and verified that I am speaking with the correct person using two identifiers. Discussed confidentiality: Yes   I discussed the limitations of telemedicine and the availability of in person appointments.  Discussed there is a possibility of technology failure and discussed alternative modes of communication if that failure occurs.  Patient and/or legal guardian expressed understanding and consented to Telemedicine visit: Yes   Presenting Concerns: Patient and/or family reports the following symptoms/concerns: The patient reports he has been doing okay since his last follow-up appointment. He noted that he has been struggling with feeling lonely and let down by his friends. He explained that he always goes out of his way to help other, but when he needs help no one is there. The patient noted that he became very upset while waiting in line at a store this week. He shared that he physically felt flushed, sweaty, and had a verbal outburst and was asked to leave the store. The patient stated that it took several hours to cool off. The patient discussed several situational stressors impacting his life. The patient noted that he is making progress on his disability claim and is hopeful that he will be able to soon live more financially independent. The patient denied any suicidal or homicidal thoughts.  Duration of problem: Years; Severity of problem: moderate  Patient and/or Family's Strengths/Protective Factors: Concrete  supports in place (healthy food, safe environments, etc.)  Goals Addressed: Patient will:  Reduce symptoms of: agitation, anxiety, depression, insomnia, and stress   Increase knowledge and/or ability of: coping skills, healthy habits, self-management skills, and stress reduction   Demonstrate ability to: Increase healthy adjustment to current life circumstances  Progress towards Goals: Ongoing  Interventions: Interventions utilized:  CBT Cognitive Behavioral Therapy was utilized by the clinician during today's follow up session. Clinician met with patient to identify needs related to stressors and functioning, and assess and monitor for signs and symptoms of anxiety and depression, and assess safety. The clinician processed with the patient how they have been doing since the last follow-up session. Clinician measured the patients depression on a numerical scale. Clinician introduced distress tolerance skills to the patient and explained that negative emotions will usually lessen in intensity and pass over time.  Clinician continued to teach the patient calming relaxation and mindfulness skills and how to discriminate better between relaxation and tension and taught the client how to apply the skills in their daily life. Clinician assigned the patient homework in which they will use mindfulness skills daily gradually applying them progressively from not anxiety provoking to anxiety provoking situations and worked with the patient to resolve obstacles toward sustained implementation.  The session ended with scheduling. Standardized Assessments completed: GAD-7 and PHQ 9 GAD-7= 19 PHQ-9= 17  Assessment: Patient currently experiencing see above.   Patient may benefit from see above.  Plan: Follow up with behavioral health clinician on : 01/13/2022 at 10:00 AM Behavioral recommendations:  Referral(s): [place] (In Clinic)  I discussed the assessment and treatment  plan with the patient and/or parent/guardian. They were provided an opportunity to ask  questions and all were answered. They agreed with the plan and demonstrated an understanding of the instructions.   They were advised to call back or seek an in-person evaluation if the symptoms worsen or if the condition fails to improve as anticipated.  Lesli Albee, LCSWA

## 2022-01-13 ENCOUNTER — Ambulatory Visit: Payer: Medicaid Other | Admitting: Licensed Clinical Social Worker

## 2022-01-18 ENCOUNTER — Ambulatory Visit: Payer: Medicaid Other | Admitting: Licensed Clinical Social Worker

## 2022-01-25 NOTE — Chronic Care Management (AMB) (Signed)
Care Management Clinical Social Work Note  01/25/2022 Name: Sean Blake MRN: [id number] DOB: [birth year]  Sean Blake is a 51 y.o. year old male who is a primary care patient of No primary care provider on file..  The Care Management team was consulted for assistance with chronic disease management and coordination needs.  Engaged with patient by telephone for follow up visit in response to provider referral for social work chronic care management and care coordination services  Consent to Services:  Sean Blake was given information about Care Management services today including:  Care Management services includes personalized support from designated clinical staff supervised by his physician, including individualized plan of care and coordination with other care providers 24/7 contact phone numbers for assistance for urgent and routine care needs. The patient may stop case management services at any time by phone call to the office staff.  Patient agreed to services and consent obtained.   Assessment: Review of patient past medical history, allergies, medications, and health status, including review of relevant consultants reports was performed today as part of a comprehensive evaluation and provision of chronic care management and care coordination services.  SDOH (Social Determinants of Health) assessments and interventions performed:    Advanced Directives Status: Not addressed in this encounter.  Care Plan  Allergies  Allergen Reactions   Gabapentin Palpitations and Other (Blake Comments)    convulsions    Outpatient Encounter Medications as of 01/18/2022  Medication Sig Note   buPROPion (WELLBUTRIN SR) 150 MG 12 hr tablet Take 1 tablet (150 mg total) by mouth once daily.    ibuprofen (ADVIL) 200 MG tablet Take 200 mg by mouth every 6 (six) hours as needed. 04/19/2021: Reports taking 2 tablets (400 mg) twice daily as needed   No facility-administered encounter medications on  file as of 01/18/2022.    Patient Active Problem List   Diagnosis Date Noted   Health care maintenance 09/22/2021   Mass of soft tissue of neck 09/15/2021   Arthritis 09/15/2021   Anxiety and depression 09/15/2021   Obesity 09/15/2021    Conditions to be addressed/monitored: Anxiety and Depression  Care Plan : Sean Plan of Care  Updates made by Sean Chester[place], Sean since 01/25/2022 12:00 AM     Problem: Barriers to Treatment      Long-Range Goal: Barriers to Treatment Identified and Managed Completed 01/18/2022  Start Date: 04/27/2021  Expected End Date: 02/25/2022  This Visit's Progress: On track  Recent Progress: On track  Priority: High  Note:   Current barriers:   Financial strain, Limited social support, Housing barriers, and Mental Health Concerns  Needs Support, Education, and Care Coordination in order to meet unmet mental health needs. Clinical Goal(s): demonstrate a reduction in symptoms related to :Anxiety  and Depression  explore community resource options for unmet needs related [id number] constraints and No health insurance    Clinical Interventions:  Assessed patient's previous and current treatment, coping skills, support system and barriers to care  Patient reports that he provided supportive ppwk to [facility] that he received FS; however, was denied due to not having official award letter. Sean discussed that he may be able to re-apply after FS renewal, where they may provide a new award letter  Patient was encouraged to apply for Financial Assistance through [facility]; however, pt is unable to travel to their facilities Patient continues to participate in therapy Patient has an upcoming disability hearing on the 01/29/22 Patient is requesting prescription for meloxicam.  States [facility] cant offer it so they cant write it. Pt cant afford co-pay to Blake the Rheumatologist. Pt shared that Dr. Raliegh Blake ordered it in the past but pt never had the funds to pick it up. Would like  nurse to call, if needed CCM Sean Blake upcoming appointments Solution-Focused Strategies employed:  Active listening / Reflection utilized  Emotional Support Provided Verbalization of feelings encouraged   Inter-disciplinary care team collaboration (Blake longitudinal plan of care) Patient Goals/Self-Care Activities: Over the next 120 days Participation in mental health treatment encouraged Strategies to manage emotional triggers promoted Contact office with any questions or concerns Attend all scheduled appointments with providers      Follow Up Plan: No follow up required. Pt has completed all care plan goals associated with CCM Sean  Sean Blake, MSW, [email or web address] Phone [phone] 6:48 AM

## 2022-01-25 NOTE — Patient Instructions (Signed)
Visit Information  Thank you for taking time to visit with me today. Please don't hesitate to contact me if I can be of assistance to you before our next scheduled telephone appointment.  Following are the goals we discussed today:  Patient Goals/Self-Care Activities: Over the next 120 days Participation in mental health treatment encouraged Strategies to manage emotional triggers promoted Contact office with any questions or concerns Attend all scheduled appointments with providers  No follow up required. Care plan goals completed  If you are experiencing a Mental Health or [place] or need someone to talk to, please call the Suicide and Crisis Lifeline: 988 call 911   Patient verbalizes understanding of instructions and care plan provided today and agrees to view in [place]. Active MyChart status and patient understanding of how to access instructions and care plan via MyChart confirmed with patient.     [name], MSW, [facility] Care Management [email or web address] Phone [phone] 6:49 AM

## 2022-02-01 ENCOUNTER — Ambulatory Visit: Payer: Medicaid Other | Admitting: Licensed Clinical Social Worker

## 2022-02-01 ENCOUNTER — Telehealth: Payer: Self-pay | Admitting: Licensed Clinical Social Worker

## 2022-02-01 NOTE — Telephone Encounter (Signed)
Called the patient twice during today's scheduled appointment; no answer, left a voicemail with the clinic contact information so they may reschedule.   

## 2022-02-08 ENCOUNTER — Ambulatory Visit: Payer: Self-pay | Admitting: Licensed Clinical Social Worker

## 2022-02-08 ENCOUNTER — Ambulatory Visit: Payer: Medicaid Other | Admitting: Licensed Clinical Social Worker

## 2022-02-08 DIAGNOSIS — F411 Generalized anxiety disorder: Secondary | ICD-10-CM

## 2022-02-08 DIAGNOSIS — F331 Major depressive disorder, recurrent, moderate: Secondary | ICD-10-CM

## 2022-02-08 DIAGNOSIS — F4381 Prolonged grief disorder: Secondary | ICD-10-CM

## 2022-02-08 NOTE — BH Specialist Note (Unsigned)
Integrated Behavioral Health via Telemedicine Visit  02/08/2022 Sean Blake [id number]   Referring Provider: Carlyon Shadow, NP  Patient/Family location: The Patient's address [facility] Provider location: [facility] [place] All persons participating in visit: Adolph Clutter. Coralyn Mark and [employer], LCSW-A Types of Service: Telephone visit  I connected with Sean Blake via  Telephone or [employer]  (Video is Tree surgeon) and verified that I am speaking with the correct person using two identifiers. Discussed confidentiality: Yes   I discussed the limitations of telemedicine and the availability of in person appointments.  Discussed there is a possibility of technology failure and discussed alternative modes of communication if that failure occurs.  Patient and/or legal guardian expressed understanding and consented to Telemedicine visit: Yes   Presenting Concerns: Patient and/or family reports the following symptoms/concerns: The patient reports that he has been doing about the same since his last follow-up appointment. He discussed his difficulties repairing his car and finding assistance to pick up his motorcycle and fix it. Sean Blake noted that his family and friends had abandoned and let him down. He shared his parents have the financial resources to help him get back on his feet but have not offered any help. The patient discussed other examples where he felt women had taken advantage of him in previous relationships. The patient identified that he often feels lonely and struggles to let go of the past. He shared that he feels he always gives and never receives. He noted that he is currently living with his stepdaughter and her family, and he tries to avoid confrontation by staying out of sight and using very few resources in the home. Sean Blake stated that he had panic attacks when overwhelmed by a crowded store. He noted he could not  complete his purchase twice this month and left. The patient shared that during these attacks, he becomes sweaty, has heart palpations, and is intensely irritable. He noted that the irritability stays with him for the remainder of the day, and he tries to distract himself with his phone or other devices. The patient stated that he had a day about two weeks ago when he thought it would be better if he had never been born or died. He said he did not have a plan or access to means to carry out a plan to end his life. He shared that he has not had any further thoughts of ending his life since then and would not end his life because he would not want to hurt his grandchildren. He stated that if these thoughts of ending his life came back or worsened, he would tell his stepdaughter and go to the hospital. The patient denied any homicidal thoughts. Duration of problem: Years; Severity of problem: moderate  Patient and/or Family's Strengths/Protective Factors: Concrete supports in place (healthy food, safe environments, etc.) and Sense of purpose  Goals Addressed: Patient will:  Reduce symptoms of: agitation, anxiety, depression, insomnia, and stress   Increase knowledge and/or ability of: coping skills, healthy habits, self-management skills, and stress reduction   Demonstrate ability to: Increase healthy adjustment to current life circumstances and Increase adequate support systems for patient/family  Progress towards Goals: Ongoing  Interventions: Interventions utilized:  CBT Cognitive Behavioral Therapywas utilized by the clinician during today's follow up session. Clinician met with the patient to identify needs related to stressors and functioning, assess and monitor for signs and symptoms of anxiety and depression, and assess safety. The Clinician asked the  patient how they had been doing since the last follow-up session. The Clinician measured the patient's anxiety and depression on a numerical scale.  Clinician provided a safe, judgement-free space for the patient to vent his frustrations regarding his current circumstances. The Clinician explored with the patient the coping skills he previously used to help him manage his depression and anxiety. Clinician completed a risk assessment to determine level of severity when it comes to patient's suicidal thoughts. Clinician determined that the patient is competent, thinking clearly, orientated x 4, able to make own medical decisions, and is not acutely suicidal but if he were, he would access emergency crisis services. The Clinician reviewed with the patient how to get immediate help should his suicidal thoughts worsen. In addition, Clinician emailed the patient a list of mental health and substance use crisis resources and encouraged the patient to reach out to them immediately if his suicidal thoughts worsened and included an attachment with worksheets with coping skills to practice daily this week, even during times when he did not feel distressed. The session ended with scheduling.  Standardized Assessments completed: GAD-7 and PHQ 9 GAD-7= 18 PHQ-9= 19   Assessment: Patient currently experiencing see above.   Patient may benefit from see above.  Plan: Follow up with behavioral health clinician on : 02/17/2022 at 11:00 AM  Behavioral recommendations:  Referral(s): [place] (In Clinic)  I discussed the assessment and treatment plan with the patient and/or parent/guardian. They were provided an opportunity to ask questions and all were answered. They agreed with the plan and demonstrated an understanding of the instructions.   They were advised to call back or seek an in-person evaluation if the symptoms worsen or if the condition fails to improve as anticipated.  Lesli Albee, LCSWA

## 2022-02-17 ENCOUNTER — Ambulatory Visit: Payer: Medicaid Other | Admitting: Licensed Clinical Social Worker

## 2022-03-02 ENCOUNTER — Other Ambulatory Visit: Payer: Medicaid Other

## 2022-03-09 ENCOUNTER — Other Ambulatory Visit: Payer: Self-pay

## 2022-03-09 ENCOUNTER — Ambulatory Visit: Payer: Medicaid Other | Admitting: Gerontology

## 2022-03-09 ENCOUNTER — Encounter: Payer: Self-pay | Admitting: Gerontology

## 2022-03-09 VITALS — BP 147/97 | HR 74 | Temp 98.3°F | Resp 17 | Ht 71.0 in | Wt 334.3 lb

## 2022-03-09 DIAGNOSIS — Z0000 Encounter for general adult medical examination without abnormal findings: Secondary | ICD-10-CM

## 2022-03-09 DIAGNOSIS — M7989 Other specified soft tissue disorders: Secondary | ICD-10-CM

## 2022-03-09 DIAGNOSIS — R030 Elevated blood-pressure reading, without diagnosis of hypertension: Secondary | ICD-10-CM

## 2022-03-09 DIAGNOSIS — F419 Anxiety disorder, unspecified: Secondary | ICD-10-CM

## 2022-03-09 MED ORDER — BUPROPION HCL ER (SR) 150 MG PO TB12
150.0000 mg | ORAL_TABLET | Freq: Every day | ORAL | 0 refills | Status: DC
Start: 2022-03-09 — End: 2022-07-12
  Filled 2022-03-09: qty 30, 30d supply, fill #0

## 2022-03-09 NOTE — Patient Instructions (Signed)

## 2022-03-09 NOTE — Progress Notes (Signed)
Established Patient Office Visit  Subjective   Patient ID: Sean Blake, male    DOB: 1971-01-22  Age: 51 y.o. MRN: [id number]  Chief Complaint  Patient presents with   Follow-up    HPI  Sean Blake is a 51 y/o male who has history of anxiety, back pain presents for routine follow up visit. His blood pressure was elevated during visit at 147/97. He takes Wellbutrin 150 mg for history of depression, but has been out of his medication. He states that he's compliant with his medication, denies side effects and continues to make healthy lifestyle changes. He states that his mood is good, denies suicidal nor homicidal ideation. His blood pressure was elevated at 147/97, denies headache, dizziness and vision changes. 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 Overall, he states that he's doing well and offers no further complaint.  Review of Systems  Constitutional: Negative.   Eyes: Negative.   Respiratory: Negative.    Cardiovascular: Negative.   Musculoskeletal:  Positive for back pain (chronic back pain).  Neurological: Negative.   Endo/Heme/Allergies: Negative.   Psychiatric/Behavioral: Negative.        Objective:     BP (!) 147/97 (BP Location: Right Arm, Patient Position: Sitting, Cuff Size: Large)   Pulse 74   Temp 98.3 F (36.8 C) (Oral)   Resp 17   Ht '5\' 11"'  (1.803 m)   Wt (!) 334 lb 4.8 oz (151.6 kg)   SpO2 90%   BMI 46.63 kg/m  BP Readings from Last 3 Encounters:  03/09/22 (!) 147/97  09/15/21 (!) 142/92  05/19/21 126/73   Wt Readings from Last 3 Encounters:  03/09/22 (!) 334 lb 4.8 oz (151.6 kg)  09/15/21 (!) 332 lb 14.4 oz (151 kg)  04/13/21 (!) 323 lb 6.4 oz (146.7 kg)      Physical Exam HENT:     Head: Normocephalic and atraumatic.     Mouth/Throat:     Mouth: Mucous membranes are moist.  Eyes:     Extraocular Movements: Extraocular movements intact.      Conjunctiva/sclera: Conjunctivae normal.     Pupils: Pupils are equal, round, and reactive to light.  Cardiovascular:     Rate and Rhythm: Normal rate and regular rhythm.     Pulses: Normal pulses.     Heart sounds: Normal heart sounds.  Pulmonary:     Effort: Pulmonary effort is normal.     Breath sounds: Normal breath sounds.  Musculoskeletal:        General: Swelling (growth to right clavicle, no tenderness with palpation) present.  Skin:    General: Skin is warm.  Neurological:     General: No focal deficit present.     Mental Status: He is alert and oriented to person, place, and time. Mental status is at baseline.  Psychiatric:        Mood and Affect: Mood normal.        Behavior: Behavior normal.        Thought Content: Thought content normal.        Judgment: Judgment normal.      No results found for any visits on 03/09/22.  Last CBC Lab Results  Component Value Date   WBC 8.3 09/15/2021   HGB 16.2 09/15/2021   HCT 47.5 09/15/2021   MCV 93 09/15/2021   MCH 31.8 09/15/2021   RDW 13.1 09/15/2021  PLT 302 18/84/1660   Last metabolic panel Lab Results  Component Value Date   GLUCOSE 113 (H) 09/15/2021   NA 141 09/15/2021   K 4.6 09/15/2021   CL 105 09/15/2021   CO2 23 09/15/2021   BUN 19 09/15/2021   CREATININE 0.89 09/15/2021   EGFR 103 09/15/2021   CALCIUM 9.3 09/15/2021   PROT 6.8 09/15/2021   ALBUMIN 4.2 09/15/2021   LABGLOB 2.6 09/15/2021   AGRATIO 1.6 09/15/2021   BILITOT <0.2 09/15/2021   ALKPHOS 78 09/15/2021   AST 16 09/15/2021   ALT 27 09/15/2021   ANIONGAP 13 05/19/2021   Last lipids Lab Results  Component Value Date   CHOL 206 (H) 09/15/2021   HDL 47 09/15/2021   LDLCALC 131 (H) 09/15/2021   TRIG 157 (H) 09/15/2021   CHOLHDL 4.4 09/15/2021   Last hemoglobin A1c No results found for: "HGBA1C" Last thyroid functions Lab Results  Component Value Date   TSH 1.230 09/15/2021      The 10-year ASCVD risk score (Arnett DK,  et al., 2019) is: 12%    Assessment & Plan:   1. Anxiety - He will continue on current medication, and will follow up with [facility] team. - buPROPion (WELLBUTRIN SR) 150 MG 12 hr tablet; Take 1 tablet (150 mg total) by mouth once daily.  Dispense: 30 tablet; Refill: 0  2. Class 3 severe obesity with body mass index (BMI) of 45.0 to 49.9 in adult, unspecified obesity type, unspecified whether serious comorbidity present ([place]) - He was educated on calorie count, portion sizes and to exercise as tolerated.  3. Mass of soft tissue of neck - Some inflammatory markers will be checked and he will follow up with Rheumatologist Dr Jefm Bryant. - Sedimentation rate; Future - Antinuclear Antib (ANA); Future - Antinuclear Antib (ANA) - Sedimentation rate  4. Elevated blood pressure reading - He was advised to monitor blood pressure at home, record and bring log to follow up appointment. He was encouraged to continue on DASH diet and exercise as tolerated.  5. Health care maintenance - Routine labs will be checked. - HgB A1c - Lipid Profile - Urinalysis, Routine w reflex microscopic - CBC w/Diff - Comp Met (CMET)    Return in about 1 month (around 04/12/2022), or if symptoms worsen or fail to improve.    Kennadi Albany Jerold Coombe, NP

## 2022-03-10 ENCOUNTER — Other Ambulatory Visit: Payer: Self-pay

## 2022-03-10 ENCOUNTER — Other Ambulatory Visit: Payer: Self-pay | Admitting: Gerontology

## 2022-03-10 DIAGNOSIS — R7303 Prediabetes: Secondary | ICD-10-CM

## 2022-03-10 LAB — CBC WITH DIFFERENTIAL/PLATELET
Basophils Absolute: 0 10*3/uL (ref 0.0–0.2)
Basos: 0 %
EOS (ABSOLUTE): 0.2 10*3/uL (ref 0.0–0.4)
Eos: 2 %
Hematocrit: 48 % (ref 37.5–51.0)
Hemoglobin: 16.3 g/dL (ref 13.0–17.7)
Immature Grans (Abs): 0 10*3/uL (ref 0.0–0.1)
Immature Granulocytes: 0 %
Lymphocytes Absolute: 2.9 10*3/uL (ref 0.7–3.1)
Lymphs: 33 %
MCH: 31.2 pg (ref 26.6–33.0)
MCHC: 34 g/dL (ref 31.5–35.7)
MCV: 92 fL (ref 79–97)
Monocytes Absolute: 0.6 10*3/uL (ref 0.1–0.9)
Monocytes: 7 %
Neutrophils Absolute: 5.1 10*3/uL (ref 1.4–7.0)
Neutrophils: 58 %
Platelets: 318 10*3/uL (ref 150–450)
RBC: 5.22 x10E6/uL (ref 4.14–5.80)
RDW: 12.8 % (ref 11.6–15.4)
WBC: 8.8 10*3/uL (ref 3.4–10.8)

## 2022-03-10 LAB — URINALYSIS, ROUTINE W REFLEX MICROSCOPIC
Bilirubin, UA: NEGATIVE
Glucose, UA: NEGATIVE
Ketones, UA: NEGATIVE
Leukocytes,UA: NEGATIVE
Nitrite, UA: NEGATIVE
RBC, UA: NEGATIVE
Specific Gravity, UA: 1.024 (ref 1.005–1.030)
Urobilinogen, Ur: 0.2 mg/dL (ref 0.2–1.0)
pH, UA: 5 (ref 5.0–7.5)

## 2022-03-10 LAB — COMPREHENSIVE METABOLIC PANEL
ALT: 37 IU/L (ref 0–44)
AST: 24 IU/L (ref 0–40)
Albumin/Globulin Ratio: 1.7 (ref 1.2–2.2)
Albumin: 4.3 g/dL (ref 3.8–4.9)
Alkaline Phosphatase: 67 IU/L (ref 44–121)
BUN/Creatinine Ratio: 26 — ABNORMAL HIGH (ref 9–20)
BUN: 23 mg/dL (ref 6–24)
Bilirubin Total: <0.2 mg/dL (ref 0.0–1.2)
CO2: 21 mmol/L (ref 20–29)
Calcium: 9.3 mg/dL (ref 8.7–10.2)
Chloride: 103 mmol/L (ref 96–106)
Creatinine, Ser: 0.87 mg/dL (ref 0.76–1.27)
Globulin, Total: 2.6 g/dL (ref 1.5–4.5)
Glucose: 95 mg/dL (ref 70–99)
Potassium: 4.7 mmol/L (ref 3.5–5.2)
Sodium: 138 mmol/L (ref 134–144)
Total Protein: 6.9 g/dL (ref 6.0–8.5)
eGFR: 104 mL/min/{1.73_m2} (ref >59)

## 2022-03-10 LAB — MICROSCOPIC EXAMINATION
Casts: NONE SEEN /lpf
RBC, Urine: NONE SEEN /hpf (ref 0–2)

## 2022-03-10 LAB — LIPID PANEL
Chol/HDL Ratio: 4.6 ratio (ref 0.0–5.0)
Cholesterol, Total: 218 mg/dL — ABNORMAL HIGH (ref 100–199)
HDL: 47 mg/dL (ref >39)
LDL Chol Calc (NIH): 139 mg/dL — ABNORMAL HIGH (ref 0–99)
Triglycerides: 177 mg/dL — ABNORMAL HIGH (ref 0–149)
VLDL Cholesterol Cal: 32 mg/dL (ref 5–40)

## 2022-03-10 LAB — HEMOGLOBIN A1C
Est. average glucose Bld gHb Est-mCnc: 128 mg/dL
Hgb A1c MFr Bld: 6.1 % — ABNORMAL HIGH (ref 4.8–5.6)

## 2022-03-10 LAB — ANA: Anti Nuclear Antibody (ANA): NEGATIVE

## 2022-03-10 LAB — SEDIMENTATION RATE: Sed Rate: 32 mm/hr — ABNORMAL HIGH (ref 0–30)

## 2022-03-10 MED ORDER — METFORMIN HCL 500 MG PO TABS
500.0000 mg | ORAL_TABLET | Freq: Every day | ORAL | 0 refills | Status: DC
  Filled 2022-03-10: qty 30, 30d supply, fill #0

## 2022-03-11 ENCOUNTER — Other Ambulatory Visit: Payer: Self-pay

## 2022-04-12 ENCOUNTER — Ambulatory Visit: Payer: Medicaid Other | Admitting: Rheumatology

## 2022-04-12 ENCOUNTER — Encounter: Payer: Self-pay | Admitting: Rheumatology

## 2022-04-12 ENCOUNTER — Other Ambulatory Visit: Payer: Self-pay

## 2022-04-12 ENCOUNTER — Ambulatory Visit: Payer: Medicaid Other | Admitting: Gerontology

## 2022-04-12 ENCOUNTER — Ambulatory Visit: Payer: Medicaid Other | Admitting: Licensed Clinical Social Worker

## 2022-04-12 ENCOUNTER — Encounter: Payer: Self-pay | Admitting: Gerontology

## 2022-04-12 VITALS — BP 128/85 | HR 76 | Temp 97.6°F | Resp 16 | Ht 71.0 in | Wt 335.9 lb

## 2022-04-12 VITALS — BP 128/85 | HR 76 | Temp 97.6°F | Resp 17 | Ht 71.0 in | Wt 335.9 lb

## 2022-04-12 DIAGNOSIS — R809 Proteinuria, unspecified: Secondary | ICD-10-CM

## 2022-04-12 DIAGNOSIS — F331 Major depressive disorder, recurrent, moderate: Secondary | ICD-10-CM

## 2022-04-12 DIAGNOSIS — E785 Hyperlipidemia, unspecified: Secondary | ICD-10-CM

## 2022-04-12 DIAGNOSIS — G8929 Other chronic pain: Secondary | ICD-10-CM

## 2022-04-12 DIAGNOSIS — R7303 Prediabetes: Secondary | ICD-10-CM

## 2022-04-12 DIAGNOSIS — M7989 Other specified soft tissue disorders: Secondary | ICD-10-CM

## 2022-04-12 DIAGNOSIS — F411 Generalized anxiety disorder: Secondary | ICD-10-CM

## 2022-04-12 DIAGNOSIS — R801 Persistent proteinuria, unspecified: Secondary | ICD-10-CM

## 2022-04-12 DIAGNOSIS — F32A Depression, unspecified: Secondary | ICD-10-CM

## 2022-04-12 MED ORDER — METFORMIN HCL 500 MG PO TABS
500.0000 mg | ORAL_TABLET | Freq: Every day | ORAL | 2 refills | Status: DC
  Filled 2022-04-12: qty 30, 30d supply, fill #0

## 2022-04-12 NOTE — Patient Instructions (Signed)
Evaluating proteinuria first, then decide about arthritis drugs

## 2022-04-12 NOTE — BH Specialist Note (Signed)
Integrated Behavioral Health Follow Up In-Person Visit  MRN: [id number] Name: Sean Blake  Number of Integrated Behavioral Health Clinician visits: No data recorded Session Start time: No data recorded  Session End time: No data recorded Total time in minutes: No data recorded  Types of Service: Individual psychotherapy and Telephone visit  Interpretor:No. Interpretor Name and Language: N/A  Subjective: Sean Blake is a 51 y.o. male accompanied by  himself Patient was referred by Elizabeth Chioma, NP for mental health. Patient reports the following symptoms/concerns: The patient  reports that he has been doing about the same since his last follow-up appointment. He noted that he is struggling with transportation and has recently began to work on his late son's car. The patient noted that he has been struggling with intrusive thoughts about past interactions or conversations with others. The patient discussed health related stressors and concerns impacting his life currently. He explained that he is not on a sleep schedule and has been sleeping more in the day this past week. The patient noted that he continues to struggle with irritability and feeling let down by his friends and family. The patient noted that he is looking forward to spend ing some time with his grandson this weekend. The patient denied any suicidal or homicidal thoughts.  Duration of problem: Years; Severity of problem: moderate  Objective: Mood: Irritable and Affect: Appropriate Risk of harm to self or others: No plan to harm self or others  Life Context: Family and Social: see above School/Work: see above Self-Care: see above Life Changes: see above  Patient and/or Family's Strengths/Protective Factors: Social connections, Concrete supports in place (healthy food, safe environments, etc.), Sense of purpose, and Physical Health (exercise, healthy diet, medication compliance, etc.)  Goals Addressed: Patient  will:  Reduce symptoms of: agitation, anxiety, depression, insomnia, and stress   Increase knowledge and/or ability of: coping skills   Demonstrate ability to: Increase healthy adjustment to current life circumstances  Progress towards Goals: Ongoing  Interventions: Interventions utilized:  CBT Cognitive Behavioral Therapywas utilized by the clinician during today's follow up session. Clinician met with patient to identify needs related to stressors and functioning, and assess and monitor for signs and symptoms of anxiety and depression, and assess safety. The clinician processed with the patient how they have been doing since the last follow-up session. Clinician measured the patient's depression and anxiety on a numerical scale.  Clinician continued to teach the patient calming relaxation and mindfulness skills and how to discriminate better between relaxation and tension and taught the client how to apply the skills in their daily life. Clinician assigned the patient homework in which they will use mindfulness skills daily gradually applying them progressively from not anxiety provoking to anxiety provoking situations and worked with the patient to resolve obstacles toward sustained implementation. The session ended with scheduling.    Assessment: Patient currently experiencing see above.   Patient may benefit from see above.  Plan: Follow up with behavioral health clinician on : 04/20/2022 at 3:00 PM Behavioral recommendations:  Referral(s): Integrated Behavioral Health Services (In Clinic) "From scale of 1-10, how likely are you to follow plan?":  Heather J Pruitt, LCSWA   

## 2022-04-12 NOTE — Progress Notes (Addendum)
Authorized note  Established Patient Office Visit  Subjective   Patient ID: Sean Blake, male    DOB: 13-Sep-1970  Age: 51 y.o. MRN: [id number]  Chief Complaint  Patient presents with   Follow-up    HPI  Sean Blake is a 51 y/o male who has history of anxiety, back pain presents for routine follow up visit and lab review. Blood pressure was elevated (147/97) last visit on 03/09/22. The patient states that his blood pressure rises shortly after he smokes, and that this was the likely reason for the hypertension. He donates plasma twice weekly and notes that he does not have any indication of hypertension at these visits. His blood pressure is currently 128/85. His labs done on 03/09/22 were unremarkable except HgbA1c was 6.1%, and he was started on Metformin 500 mg daily which he tolerates. He has history of arthritis, sed rate was 32 mm/hr, was seen by Rheumatologist Dr Jefm Bryant today. He also has proteinuria (2+). He states that he is compliant with his medications, denies negative side effects, and is improving his diet. His mood is good, denies suicidal nor homicidal ideation. He states that his right clavicular mass is generally painless and only bothers him when he lays on it or bends his neck downwards for extended periods of time.    Review of Systems  Constitutional: Negative.   Respiratory: Negative.    Cardiovascular: Negative.  Negative for claudication.  Musculoskeletal:  Positive for back pain (chronic) and joint pain (chronic).  Neurological: Negative.   Psychiatric/Behavioral:  Positive for depression (chronic). The patient has insomnia.       Objective:     BP 128/85 (BP Location: Left Arm, Patient Position: Sitting, Cuff Size: Large)   Pulse 76   Temp 97.6 F (36.4 C) (Oral)   Resp 16   Ht _0  (1.803 m)   Wt (!) 335 lb 14.4 oz (152.4 kg)   SpO2 92%   BMI 46.85 kg/m  BP Readings from Last 3 Encounters:  04/12/22 128/85  04/12/22 128/85  03/09/22 (!)  147/97   Wt Readings from Last 3 Encounters:  04/12/22 (!) 335 lb 14.4 oz (152.4 kg)  04/12/22 (!) 335 lb 14.4 oz (152.4 kg)  03/09/22 (!) 334 lb 4.8 oz (151.6 kg)      Physical Exam Constitutional:      Appearance: He is obese.  HENT:     Head: Normocephalic and atraumatic.  Eyes:     Pupils: Pupils are equal, round, and reactive to light.  Cardiovascular:     Rate and Rhythm: Normal rate and regular rhythm.     Heart sounds: Normal heart sounds.  Pulmonary:     Effort: Pulmonary effort is normal.     Breath sounds: Normal breath sounds.  Musculoskeletal:        General: Swelling (Painless swelling over right clavicle) present.  Neurological:     Mental Status: He is alert and oriented to person, place, and time. Mental status is at baseline.  Psychiatric:        Mood and Affect: Mood normal.        Behavior: Behavior normal.      No results found for any visits on 04/12/22.  Last CBC Lab Results  Component Value Date   WBC 8.8 03/09/2022   HGB 16.3 03/09/2022   HCT 48.0 03/09/2022   MCV 92 03/09/2022   MCH 31.2 03/09/2022   RDW 12.8 03/09/2022   PLT 318 03/09/2022   Last  metabolic panel Lab Results  Component Value Date   GLUCOSE 95 03/09/2022   NA 138 03/09/2022   K 4.7 03/09/2022   CL 103 03/09/2022   CO2 21 03/09/2022   BUN 23 03/09/2022   CREATININE 0.87 03/09/2022   EGFR 104 03/09/2022   CALCIUM 9.3 03/09/2022   PROT 6.9 03/09/2022   ALBUMIN 4.3 03/09/2022   LABGLOB 2.6 03/09/2022   AGRATIO 1.7 03/09/2022   BILITOT <0.2 03/09/2022   ALKPHOS 67 03/09/2022   AST 24 03/09/2022   ALT 37 03/09/2022   ANIONGAP 13 05/19/2021   Last lipids Lab Results  Component Value Date   CHOL 218 (H) 03/09/2022   HDL 47 03/09/2022   LDLCALC 139 (H) 03/09/2022   TRIG 177 (H) 03/09/2022   CHOLHDL 4.6 03/09/2022   Last hemoglobin A1c Lab Results  Component Value Date   HGBA1C 6.1 (H) 03/09/2022   Last thyroid functions Lab Results  Component Value  Date   TSH 1.230 09/15/2021      The 10-year ASCVD risk score (Arnett DK, et al., 2019) is: 10.3%   Assessment & Plan:   1. Prediabetes - Hemoglobin A1c was 6.1% - Advised to continue taking metformin at present dose daily since he is tolerating the medication, low carb/non concentrated sweet diet and exercise as tolerated. - metFORMIN (GLUCOPHAGE) 500 MG tablet; Take 1 tablet (500 mg total) by mouth daily with breakfast.  Dispense: 30 tablet; Refill: 2 - HgB A1c; Future  2. Proteinuria, unspecified type - Proteinuria to be further evaluated by labs ordered by Dr. Jefm Bryant. If positive, may refer to nephrology.  3. Mass of soft tissue of neck - Ambulatory referral to General Surgery for further evaluation of right clavicular mass.  4. Anxiety and depression - He will continue his Wellbutrin as prescribed and will follow up with behavioral health for medication management. He was advised to call the Crisis help line with worsening symptoms.  5. Elevated lipids - Discussed elevated lipid levels and ASCVD risk was 10.3%. - Educated the patient on statin usage to prevent ASCVD. The patient elected to continue dietary improvements at this time and will revisit possible statin usage if his lipid panel is shows no improvement in 3 months. He was advised to continue on low fat/cholesterol diet and exercise as tolerated. - Lipid panel; Future    Return in about 3 months (around 07/13/2022), or if symptoms worsen or fail to improve.    Odelia Gage

## 2022-04-12 NOTE — Progress Notes (Signed)
[facility]  PROGRESS NOTE  Patient:Sean Blake Male   DOB:07/29/1971     51 y.o.  HFW:[id number]  Visit Date: 04/12/2022  HPI:  51 year old white male.  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  History of depression hypertension Long history of osteoarthritis by his report.  Grandmother had it badly.  Pain in the back episodic swelling in left knee.  Recently had pain in the hands across the DIPs might have some stiffness in the morning Was seen previously in [place] and given indomethacin.  Took it longstanding up till about 2 years ago Had noted a swelling around the right clavicle.  Was seen at [facility].  Had CT scan showing  SOFT TISSUES: There is approximately 3 x 2.5 x 2.1 cm well-defined soft tissue lesion associated with/immediately adjacent to the right clavicle about the sternoclavicular joint (2:12, 7:102).  He does not feel like it is enlarged Takes a good deal of Advil every day for joint pain. 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 Recent labs show proteinuria.  Normal creatinine.  Sed rate 32 negative ANA Hurts at the base of the thumbs bilaterally     Past Medical History:  Diagnosis Date   Anxiety    Arthritis     Past Surgical History:  Procedure Laterality Date   NO PAST SURGERIES      Social History   Tobacco Use   Smoking status: Every Day    Packs/day: 0.25    Years: 30.00    Total pack years: 7.50    Types: Cigarettes   Smokeless tobacco: Never  Substance Use Topics   Alcohol use: Yes    Comment: 1-2 glasses of liquor 2 times per month     MEDICATIONS: Current Outpatient Medications  Medication Sig Dispense Refill   buPROPion (WELLBUTRIN SR) 150 MG 12 hr tablet Take 1 tablet (150 mg total) by mouth once daily. 30 tablet 0   ibuprofen (ADVIL) 200 MG tablet Take 200 mg by mouth every 6 (six) hours as needed.     metFORMIN (GLUCOPHAGE) 500  MG tablet Take 1 tablet (500 mg total) by mouth daily with breakfast. 30 tablet 0   No current facility-administered medications for this visit.     ALLERGIES Allergies  Allergen Reactions   Gabapentin Palpitations and Other (See Comments)    convulsions     PHYSICAL EXAM: There were no vitals taken for this visit. Overweight male.  Walks with his back mildly in flexion.  No psoriatic patches elbows or ears.  Clear chest.  Striae abdominal.  No palpable liver enlargement.  1+ edema mild brawny changes Musculoskeletal: Mild decreased range of cervical spine.  Mild crepitus right clavicle.  Soft tissue density may be a centimeter and half defined over the right.  Not the left.  No wrist ganglion or elbow nodules.  Mild decreased range of motion cervical spine.  Hands with hypertrophic changes across the DIPs.  No significant wrist MCP synovitis.  Mild decrease in joints rotation left hip compared to right.  Significant decreased extension lumbar spine mild decreased lateral bending.  Flexes to about 6 inches from the floor.  Crepitus laxity left knee small contracture.  Pain with flexion inability to completely flex.  No definite effusion right knee with crepitus and laxity but no contracture.  Decreased range of motion of his ankles proteinuria.  Unclear etiology.  Precludes further nonsteroidals until worked up Suspect generalized osteoarthritis.  Hands knee 9.  Cannot completely rule out spondylitis given family history and loss of motion and prior chronic NSAID use Soft tissue clavicle mass.  Appears benign.  Not really growing.  Could be ganglion or lipoma Obesity Chronic plasma donation History of depression   ASSESSMENT: proteinuria.  Unclear etiology.  Precludes further nonsteroidals until worked up Suspect generalized osteoarthritis.  Hands knee spine  Cannot completely rule out spondylitis given family history and loss of motion and prior chronic NSAID use Soft tissue clavicle  mass.  Appears benign.  Not really growing.  Could be ganglion or lipoma Obesity Chronic plasma donation History of depression    PLAN: Serum protein electrophoresis Urine protein to creatinine ratio.  Rheumatoid factor anti-CCP antibody HLA-B27 No nonsteroidals.  Tylenol only.  After proteinuria is evaluated we will decide regarding possible arthritis meds.  Not of evidence to excise or aspirate his soft tissue clavicle mass at this time.     Cheral Almas. MD           04/12/2022,  11:09 AM

## 2022-04-18 ENCOUNTER — Ambulatory Visit (INDEPENDENT_AMBULATORY_CARE_PROVIDER_SITE_OTHER): Payer: Self-pay | Admitting: Surgery

## 2022-04-18 ENCOUNTER — Encounter: Payer: Self-pay | Admitting: Surgery

## 2022-04-18 VITALS — BP 157/83 | HR 89 | Temp 98.6°F | Ht 71.0 in | Wt 339.0 lb

## 2022-04-18 DIAGNOSIS — D492 Neoplasm of unspecified behavior of bone, soft tissue, and skin: Secondary | ICD-10-CM

## 2022-04-18 DIAGNOSIS — M7989 Other specified soft tissue disorders: Secondary | ICD-10-CM

## 2022-04-18 NOTE — Patient Instructions (Signed)
A referral has been place with Ortho surgery. They will call you for an appointment.    If you have any concerns or questions, please feel free to call our office.

## 2022-04-18 NOTE — Progress Notes (Signed)
04/18/2022  Reason for Visit:  Right clavicular mass  Requesting Provider:  Caryl Asp, NP  History of Present Illness: Sean Blake is a 51 y.o. male presenting for evaluation of a right clavicular mass.  Patient reports that he has noticed this over the past year to year and a half and initially he felt like it was groin fast but recently has been slowing down.  He reports that also more recently he has been experiencing more discomfort in that area but he is uncertain of what brings about any pain and what takes it away.  He denies any specific movement that causes pain or activity level.  He had a CT scan done initially at [facility] on 01/23/2021 which showed a 3 x 2.5 x 2.1 soft tissue lesion associated with/immediately adjacent to the right clavicle about the sternoclavicular junction.  Had a different CT on 07/25/2021 at [facility] which showed a 1.8 x 1.4 x 1.3 cm hypoattenuating lesion at the right sternoclavicular junction which most likely represented a joint effusion.  No malignancy was identified in the CT scan.  However due to persistent pain, the patient was referred to general surgery for further evaluation.  He denies any weight loss, but reports feeling much more fatigued over the last few months.  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  He reports seeing an orthopedic surgeon many years ago for his lower back pain, but has not seen anyone for his shoulder or knee pain.  Past Medical History: Past Medical History:  Diagnosis Date   Anxiety    Arthritis      Past Surgical History: Past Surgical History:  Procedure Laterality Date   NO PAST SURGERIES      Home Medications: Prior to Admission medications   Medication Sig Start Date End Date Taking? Authorizing Provider  buPROPion (WELLBUTRIN SR) 150 MG 12 hr tablet Take 1 tablet (150 mg  total) by mouth once daily. 03/09/22  Yes Iloabachie, Chioma E, NP  ibuprofen (ADVIL) 200 MG tablet Take 200 mg by mouth every 6 (six) hours as needed.   Yes [provider]  metFORMIN (GLUCOPHAGE) 500 MG tablet Take 1 tablet (500 mg total) by mouth daily with breakfast. 04/12/22  Yes Iloabachie, Chioma E, NP    Allergies: Allergies  Allergen Reactions   Gabapentin Palpitations and Other (See Comments)    convulsions    Social History:  reports that he has been smoking cigarettes. He has a 7.50 pack-year smoking history. He has never used smokeless tobacco. He reports current alcohol use. He reports current drug use. Drug: Marijuana.   Family History: Family History  Problem Relation Age of Onset   Arthritis Mother    Other Father        unknown medical history   Arthritis Maternal Grandmother    Hypertension Maternal Grandmother    Heart attack Maternal Grandfather    Other Paternal Grandmother        unknown medical history   Arthritis Paternal Grandfather    Alzheimer's disease Paternal Grandfather     Review of Systems: Review of Systems  Constitutional:  Positive for malaise/fatigue. Negative for chills, fever and weight loss.  Respiratory:  Negative for shortness of breath.   Cardiovascular:  Negative for chest pain.  Gastrointestinal:  Negative for abdominal pain, nausea and vomiting.  Musculoskeletal:  Positive for back  pain and joint pain.  Skin:  Negative for rash.    Physical Exam BP (!) 157/83   Pulse 89   Temp 98.6 F (37 C) (Oral)   Ht '5\' 11"'$  (1.803 m)   Wt (!) 339 lb (153.8 kg)   SpO2 96%   BMI 47.28 kg/m  CONSTITUTIONAL: No acute distress HEENT:  Normocephalic, atraumatic, extraocular motion intact. NECK: Trachea is midline, and there is no jugular venous distension.  RESPIRATORY:  Lungs are clear, and breath sounds are equal bilaterally. Normal respiratory effort without pathologic use of accessory muscles. CARDIOVASCULAR: Heart is regular  without murmurs, gallops, or rubs. MUSCULOSKELETAL: At the right sternoclavicular junction, there is a firm palpable area extending from the head of the clavicle but I cannot discern a true marginal border between mass and clavicle itself.  No significant tenderness to palpation. SKIN: Skin turgor is normal. There are no pathologic skin lesions.  NEUROLOGIC:  Motor and sensation is grossly normal.  Cranial nerves are grossly intact. PSYCH:  Alert and oriented to person, place and time. Affect is normal.  Laboratory Analysis: Labs from 03/09/2022: Sodium 138, potassium 4.7, chloride 103, CO2 21, BUN 23, creatinine 0.87.  LFTs within normal. WBC 8.8, hemoglobin 16.3, hematocrit 48, platelets 318.  Imaging: CT chest without contrast on 01/23/2021: IMPRESSION:  An approximately 3 x 2.5 x 2.1 cm well-defined soft tissue lesion associated with/immediately adjacent to the right clavicle about the sternoclavicular joint is of unclear etiology (2:12, 7:102). Notably, there is no additional imaging evidence of neoplastic disease involving the thorax. Consider MR imaging for further characterization.  CT neck with contrast on 07/25/2021: IMPRESSION:  1.  No malignancy identified. If there is persistent concern for or history  supportive of a head/neck cancer diagnosis, recommend further assessment with direct visualization given that some lesions, especially those that are small, can be difficult to visualize on CT.  2.  No neck or cervical lymphadenopathy.    Assessment and Plan: This is a 51 y.o. male with a right clavicular mass.  - Discussed with the patient that given that this mass is firm and apparently involving or adjacent to the clavicle at the sternoclavicular junction, I think he would be better served with a referral to orthopedic surgery rather than general surgery.  He may require an MRI to further evaluate this area but will defer to orthopedic surgery for further evaluation of this.   Discussed with him the additionally, he may be able to be evaluated for his bilateral knee pain as well as shoulder and back pain.  Patient is in agreement with this. - Follow-up with [place] as needed.  I spent 30 minutes dedicated to the care of this patient on the date of this encounter to include pre-visit review of records, face-to-face time with the patient discussing diagnosis and management, and any post-visit coordination of care.   Melvyn Neth, [facility]

## 2022-04-19 LAB — PROTEIN ELECTROPHORESIS, SERUM
A/G Ratio: 1.1 (ref 0.7–1.7)
Albumin ELP: 3.7 g/dL (ref 2.9–4.4)
Alpha 1: 0.2 g/dL (ref 0.0–0.4)
Alpha 2: 0.8 g/dL (ref 0.4–1.0)
Beta: 1.2 g/dL (ref 0.7–1.3)
Gamma Globulin: 1.1 g/dL (ref 0.4–1.8)
Globulin, Total: 3.3 g/dL (ref 2.2–3.9)
Total Protein: 7 g/dL (ref 6.0–8.5)

## 2022-04-19 LAB — RHEUMATOID FACTOR: Rheumatoid fact SerPl-aCnc: <10 IU/mL (ref <14.0)

## 2022-04-19 LAB — HLA-B27 ANTIGEN: HLA B27: NEGATIVE

## 2022-04-19 LAB — PROTEIN / CREATININE RATIO, URINE
Creatinine, Urine: 154.2 mg/dL
Protein, Ur: 92.8 mg/dL
Protein/Creat Ratio: 602 mg/g creat — ABNORMAL HIGH (ref 0–200)

## 2022-04-19 LAB — SPECIMEN STATUS REPORT

## 2022-04-20 ENCOUNTER — Ambulatory Visit: Payer: Medicaid Other | Admitting: Licensed Clinical Social Worker

## 2022-04-20 ENCOUNTER — Ambulatory Visit: Payer: Self-pay | Admitting: Licensed Clinical Social Worker

## 2022-04-20 DIAGNOSIS — F331 Major depressive disorder, recurrent, moderate: Secondary | ICD-10-CM

## 2022-04-20 DIAGNOSIS — F411 Generalized anxiety disorder: Secondary | ICD-10-CM

## 2022-04-20 NOTE — BH Specialist Note (Signed)
Integrated Behavioral Health via Telemedicine Visit  04/20/2022 DIONISIOS RICCI [id number]  Number of Integrated Behavioral Health Clinician visits: No data recorded Session Start time: No data recorded  Session End time: No data recorded Total time in minutes: No data recorded  Referring Provider: Carlyon Shadow, NP  Patient/Family location: The patient's home  [facility] Provider location: [facility] All persons participating in visit: Serge Main. Coralyn Mark and [employer], LCSW-A Types of Service: Telephone visit  I connected with [name] via  Telephone or [employer]  (Video is Tree surgeon) and verified that I am speaking with the correct person using two identifiers. Discussed confidentiality: No   I discussed the limitations of telemedicine and the availability of in person appointments.  Discussed there is a possibility of technology failure and discussed alternative modes of communication if that failure occurs.  Patient and/or legal guardian expressed understanding and consented to Telemedicine visit: Yes   Presenting Concerns: Patient and/or family reports the following symptoms/concerns: The patient reports that he has been doing okay since his last follow-up appointment. He noted that he has been approved for SSDI and Medicaid. He discussed his future plans regarding moving towards being more independent.  The patient stated that he has enjoyed a slightly improved mood however he noted he continues to struggle with excessive worry and unwanted thoughts.  The patient discussed spending time with a friend over the weekend and enjoying the weather.  Mr. Soy noted that he continues to cry frequently throughout his day.  The patient noted that he continuously sees objects, or smells something, or hears something that reminds him of his late son.  He explained that he often hears his son calling his name.  The patient noted  that he does not feel that his grief has become easier to bear as time has passed.  Mr. Hickel stated that he often questions why his son passed away and feels like it should have been him.  The patient stated that he did not have someone he felt he could trust to share his feelings of grief and would be interested in a support group. The patient denied any suicidal or homicidal thoughts. Duration of problem: [place]; Severity of problem: moderate  Patient and/or Family's Strengths/Protective Factors: Social connections, Concrete supports in place (healthy food, safe environments, etc.), and Sense of purpose  Goals Addressed: Patient will:  Reduce symptoms of: agitation, anxiety, depression, insomnia, and stress   Increase knowledge and/or ability of: coping skills, healthy habits, self-management skills, and stress reduction   Demonstrate ability to: Increase healthy adjustment to current life circumstances and Increase adequate support systems for patient/family  Progress towards Goals: Ongoing  Interventions: Interventions utilized:  CBT Cognitive Behavioral Therapy was utilized by the clinician during today's follow up session. Clinician met with patient to identify needs related to stressors and functioning, and assess and monitor for signs and symptoms of anxiety and depression, and assess safety. The clinician processed with the patient how they have been doing since the last follow-up session.  Clinician measured the patient's anxiety and depression on a numerical scale. Clinician suggested that the patient join a support group to help him deal with the grief of losing his son, and provided the patient with resources and encouraged him to read one of the more of the following books getting to the other side of Grief or Traveling through Grief, How Can It Be All Right When Everything Is All Wrong, When  Bad Things Happen to Good People, or Good Grief.  Clinician explained to the patient that since  he will be covered by Medicaid he would not be eligible for services at the [facility] and explained the process of transitioning care.  The patient was scheduled a follow-up visit.  Standardized Assessments completed: GAD-7 and PHQ 9 GAD-7= 18 PHQ-9= 19   Assessment: Patient currently experiencing see above.   Patient may benefit from see above.  Plan: Follow up with behavioral health clinician on : 04/27/2022 at 3:00 PM  Behavioral recommendations:  Referral(s): [place] (In Clinic)  I discussed the assessment and treatment plan with the patient and/or parent/guardian. They were provided an opportunity to ask questions and all were answered. They agreed with the plan and demonstrated an understanding of the instructions.   They were advised to call back or seek an in-person evaluation if the symptoms worsen or if the condition fails to improve as anticipated.  Lesli Albee, LCSWA

## 2022-04-27 ENCOUNTER — Telehealth: Payer: Self-pay | Admitting: Licensed Clinical Social Worker

## 2022-04-27 ENCOUNTER — Ambulatory Visit: Payer: Medicaid Other | Admitting: Licensed Clinical Social Worker

## 2022-04-27 NOTE — Telephone Encounter (Signed)
Called the patient during today's scheduled appointment; no answer, left a voicemail with the clinic contact information so they may reschedule.   

## 2022-05-03 ENCOUNTER — Other Ambulatory Visit: Payer: Self-pay | Admitting: Gerontology

## 2022-05-03 DIAGNOSIS — R809 Proteinuria, unspecified: Secondary | ICD-10-CM

## 2022-05-12 ENCOUNTER — Telehealth: Payer: Self-pay | Admitting: Emergency Medicine

## 2022-05-12 NOTE — Telephone Encounter (Signed)
Received call from [facility]) re: referral to evaluate proteinuria. They ask that I call and notify the patient to stop all NSAIDS from now to appointment. They will call patient to schedule appointment with their office.  Called patient and notified of above. Patient agreed and voiced understanding.

## 2022-05-19 ENCOUNTER — Telehealth: Payer: Self-pay | Admitting: Gerontology

## 2022-05-19 NOTE — Telephone Encounter (Signed)
Called pt to schedule appt with Heather. No answer. Left VM.

## 2022-06-08 ENCOUNTER — Telehealth: Payer: Self-pay | Admitting: Gerontology

## 2022-06-08 NOTE — Telephone Encounter (Signed)
Called pt to schedule appt with Heather. Pt expressed he has medicaid now so I explained Nira Conn and Benjamine Mola may see him one more time and can refer him out somewhere for additional care. Pt verbalized understanding and made an appt.

## 2022-06-15 ENCOUNTER — Ambulatory Visit: Payer: Self-pay | Admitting: Licensed Clinical Social Worker

## 2022-06-15 ENCOUNTER — Ambulatory Visit: Payer: Medicaid Other | Admitting: Licensed Clinical Social Worker

## 2022-06-15 DIAGNOSIS — F331 Major depressive disorder, recurrent, moderate: Secondary | ICD-10-CM

## 2022-06-15 DIAGNOSIS — F419 Anxiety disorder, unspecified: Secondary | ICD-10-CM

## 2022-06-15 DIAGNOSIS — F411 Generalized anxiety disorder: Secondary | ICD-10-CM

## 2022-06-15 NOTE — BH Specialist Note (Signed)
Integrated Behavioral Health via Telemedicine Visit  06/15/2022 Sean Blake [id number]  Number of [place] Clinician visits: No data recorded Session Start time: No data recorded  Session End time: No data recorded Total time in minutes: No data recorded  Referring Provider: Carlyon Shadow, NP  Patient/Family location: The patient's home  [facility] Provider location: [facility] [place] All persons participating in visit: Jaye P. Coralyn Mark and [employer], LCSW-A Types of Service: Telephone visit   I connected with [name] via  Telephone or [employer]  (Video is Tree surgeon) and verified that I am speaking with the correct person using two identifiers. Discussed confidentiality: Yes   I discussed the limitations of telemedicine and the availability of in person appointments.  Discussed there is a possibility of technology failure and discussed alternative modes of communication if that failure occurs.  Patient and/or legal guardian expressed understanding and consented to Telemedicine visit: Yes   Presenting Concerns: Patient and/or family reports the following symptoms/concerns: The patient reports that he has been doing about the same since his last follow up appointment.  Mr. Derossett explained that he has received several tickets for various traffic violations in the last few months. He noted that this is caused him significant distress and financial burden. He feels that he has no choice but to continue to drive because he does not have access to public transportation. The patient discussed other familial and financial stressors in his life.  He discussed health concerns and noted that he plans to follow-up with Parks Ranger Devonne Doughty, DO his former primary care provider in [place]  which he is familiar with and trusts. The patient noted that his overall mood has remained unchanged and he continues to feel  depressed and anxious every day. The patient denied any suicidal or homicidal thoughts. Duration of problem: Years; Severity of problem: moderate  Patient and/or Family's Strengths/Protective Factors: Concrete supports in place (healthy food, safe environments, etc.), Sense of purpose, and Physical Health (exercise, healthy diet, medication compliance, etc.)  Goals Addressed: Patient will:  Reduce symptoms of: agitation, anxiety, depression, insomnia, and stress   Increase knowledge and/or ability of: coping skills, healthy habits, self-management skills, and stress reduction   Demonstrate ability to: Increase healthy adjustment to current life circumstances and Increase adequate support systems for patient/family  Progress towards Goals: Ongoing  Interventions: Interventions utilized:  Supportive Counseling was utilized by the clinician during today's follow up session. Clinician met with patient to identify needs related to stressors and functioning, and assess and monitor for signs and symptoms of anxiety and depression, and assess safety. The clinician asked the patient how they had been since the last follow-up session. The clinician measured the patient's anxiety and depression on a numerical scale. The clinician reviewed with the patient his progress since beginning therapy. The clinician provided a safe space for the patient to express his questions and concerns regarding [facility] and transitioning to a new provider. The clinician verified with the patient his mailing address and informed him he would receive referral information for other providers within the next week by mail; however, if he did not receive this information or had any difficulty establishing care with another mental health provider or agency to contact the [facility] for further support. Standardized Assessments completed: GAD-7 and PHQ 9 GAD-7=19 PHQ-9=17  Assessment: Patient  currently experiencing see above.   Patient may benefit from see above.  Plan:  Follow up with behavioral health clinician on :  Behavioral recommendations:  Referral(s): [place] (LME/Outside Clinic) Referrals provided to [facility], [facility] and [facility]   I discussed the assessment and treatment plan with the patient and/or parent/guardian. They were provided an opportunity to ask questions and all were answered. They agreed with the plan and demonstrated an understanding of the instructions.   They were advised to call back or seek an in-person evaluation if the symptoms worsen or if the condition fails to improve as anticipated.  Lesli Albee, LCSWA

## 2022-07-06 ENCOUNTER — Other Ambulatory Visit: Payer: Medicaid Other

## 2022-07-07 ENCOUNTER — Telehealth: Payer: Self-pay | Admitting: Gerontology

## 2022-07-07 NOTE — Telephone Encounter (Signed)
Called pt to follow up on end of therapy letter sent due to pt getting insurance. Pt stated he did not receive the letter. I am sending another one out today. Will do another follow up call in one week. PT verbalized understanding.

## 2022-07-12 ENCOUNTER — Other Ambulatory Visit: Payer: Self-pay

## 2022-07-12 ENCOUNTER — Encounter: Payer: Self-pay | Admitting: Family Medicine

## 2022-07-12 ENCOUNTER — Ambulatory Visit: Payer: Medicaid Other | Admitting: Family Medicine

## 2022-07-12 VITALS — BP 153/91 | HR 85 | Temp 96.6°F | Ht 72.0 in | Wt 358.0 lb

## 2022-07-12 DIAGNOSIS — M7542 Impingement syndrome of left shoulder: Secondary | ICD-10-CM

## 2022-07-12 DIAGNOSIS — M5441 Lumbago with sciatica, right side: Secondary | ICD-10-CM

## 2022-07-12 DIAGNOSIS — M5442 Lumbago with sciatica, left side: Secondary | ICD-10-CM | POA: Diagnosis not present

## 2022-07-12 DIAGNOSIS — M5136 Other intervertebral disc degeneration, lumbar region: Secondary | ICD-10-CM

## 2022-07-12 DIAGNOSIS — M1712 Unilateral primary osteoarthritis, left knee: Secondary | ICD-10-CM | POA: Diagnosis not present

## 2022-07-12 DIAGNOSIS — M25512 Pain in left shoulder: Secondary | ICD-10-CM

## 2022-07-12 DIAGNOSIS — M159 Polyosteoarthritis, unspecified: Secondary | ICD-10-CM | POA: Diagnosis not present

## 2022-07-12 DIAGNOSIS — R7303 Prediabetes: Secondary | ICD-10-CM | POA: Diagnosis not present

## 2022-07-12 DIAGNOSIS — Z6841 Body Mass Index (BMI) 40.0 and over, adult: Secondary | ICD-10-CM

## 2022-07-12 DIAGNOSIS — M25511 Pain in right shoulder: Secondary | ICD-10-CM | POA: Diagnosis not present

## 2022-07-12 DIAGNOSIS — M25562 Pain in left knee: Secondary | ICD-10-CM | POA: Diagnosis not present

## 2022-07-12 DIAGNOSIS — G8929 Other chronic pain: Secondary | ICD-10-CM | POA: Insufficient documentation

## 2022-07-12 DIAGNOSIS — D167 Benign neoplasm of ribs, sternum and clavicle: Secondary | ICD-10-CM | POA: Diagnosis not present

## 2022-07-12 MED ORDER — METFORMIN HCL 500 MG PO TABS
500.0000 mg | ORAL_TABLET | Freq: Every day | ORAL | 3 refills | Status: DC
  Filled 2022-07-12 – 2022-08-01 (×2): qty 90, 90d supply, fill #0

## 2022-07-12 MED ORDER — TIZANIDINE HCL 4 MG PO TABS
4.0000 mg | ORAL_TABLET | Freq: Three times a day (TID) | ORAL | 2 refills | Status: DC | PRN
  Filled 2022-07-12 – 2022-08-01 (×2): qty 90, 30d supply, fill #0

## 2022-07-12 MED ORDER — DULOXETINE HCL 30 MG PO CPEP
30.0000 mg | ORAL_CAPSULE | Freq: Every day | ORAL | 2 refills | Status: DC
  Filled 2022-07-12 – 2022-08-01 (×2): qty 30, 30d supply, fill #0

## 2022-07-12 NOTE — Progress Notes (Signed)
Subjective:    Patient ID: Sean Blake, male    DOB: 29-Apr-1971, 51 y.o.   MRN: [id number]  Sean Blake is a 51 y.o. male presenting on 07/12/2022 for No chief complaint on file.  Previously established with me 1 year ago 03/2021. He transitioned to [facility]  He is now on Medical Physical and Mental Disability - Left Knee and Low Back. 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 With good results. No longer taking.  He is asking for Orthopedic referral for Left Knee. 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  History of Lyme Disease  Major Depression, recurrent, moderate PTSD Son died 203/14/2000Episodic depression On Wellbutrin 150 SR TWICE A DAY, not on regular, prefers to come off med No longer w/ Psychiatry, will need new provider      07/12/2022   11:29 AM 06/15/2022   11:20 AM 04/20/2022    3:14 PM  Depression screen PHQ 2/9  Decreased Interest _0 Down, Depressed, Hopeless _1 PHQ - 2 Score _2 Altered sleeping _3 Tired, decreased energy _4 Change in appetite 2 0 2  Feeling bad or failure about yourself  _5 Trouble concentrating _6 Moving slowly or fidgety/restless 1 0 1  Suicidal thoughts 1 0 0  PHQ-9 Score _7 Difficult doing work/chores Somewhat difficult Very difficult Very difficult      07/12/2022   11:29 AM 06/15/2022   11:20 AM 04/20/2022    3:06 PM 02/08/2022   12:51 PM  GAD 7 : Generalized Anxiety Score  Nervous, Anxious, on Edge _8 Control/stop worrying _9 Worry too much - different things _10 Trouble relaxing _11 Restless _12 Easily annoyed or irritable _13 Afraid - awful might happen _14 Total GAD 7 Score _15 Anxiety Difficulty Not difficult at all Very difficult Somewhat difficult Somewhat difficult      Social History   Tobacco Use   Smoking status: Every Day    Packs/day: 0.25    Years: 30.00    Total pack years: 7.50    Types: Cigarettes   Smokeless tobacco: Never  Vaping Use   Vaping Use: Never used  Substance Use Topics   Alcohol use: Yes    Comment: 1-2 glasses of liquor 2 times per month   Drug use: Yes    Types: Marijuana    Comment: last use 03/2022    Review of Systems Per HPI unless specifically indicated above  Objective:    BP (!) 153/91 (BP Location: Right Arm, Patient Position: Sitting, Cuff Size: Large)   Pulse 85   Temp (!) 96.6 F (35.9 C) (Temporal)   Ht 6' (1.829 m)   Wt (!) 358 lb (162.4 kg)   SpO2 95%   BMI 48.55 kg/m   Wt Readings from Last 3 Encounters:  07/12/22 (!) 358 lb (162.4 kg)  04/18/22 (!) 339 lb (153.8 kg)  04/12/22 (!) 335 lb 14.4 oz (152.4 kg)    Physical Exam Vitals and nursing note reviewed.  Constitutional:      General: He is not in acute distress.    Appearance: Normal appearance. He is well-developed. He is not diaphoretic.     Comments: Well-appearing, comfortable, cooperative  HENT:     Head: Normocephalic and atraumatic.  Eyes:     General:        Right eye: No discharge.        Left eye: No discharge.     Conjunctiva/sclera: Conjunctivae normal.  Cardiovascular:     Rate and Rhythm: Normal rate.  Pulmonary:     Effort: Pulmonary effort is  normal.  Musculoskeletal:     Comments: Reduced ROM, bulky L CMC thumb joint, neck and shoulder back and knee stiffness with reduced ROM, bulky knee appearance, crepitus, pain on joint line  Skin:    General: Skin is warm and dry.     Findings: No erythema or rash.  Neurological:     Mental Status: He is alert and oriented to person, place, and time.  Psychiatric:        Mood and Affect: Mood normal.        Behavior: Behavior normal.        Thought Content: Thought content normal.     Comments: Well groomed, good eye contact, normal speech and thoughts     I have personally reviewed the radiology report from 2022.  Imaging: CT chest without contrast on 01/23/2021: IMPRESSION:  An approximately 3 x 2.5 x 2.1 cm well-defined soft tissue lesion associated with/immediately adjacent to the right clavicle about the sternoclavicular joint is of unclear etiology (2:12, 7:102). Notably, there is no additional imaging evidence of neoplastic disease involving the thorax. Consider MR imaging for further characterization.   CT neck with contrast on 07/25/2021: IMPRESSION:  1.  No malignancy identified. If there is persistent concern for or history  supportive of a head/neck cancer diagnosis, recommend further assessment with direct visualization given that some lesions, especially those that are small, can be difficult to visualize on CT.  2.  No neck or cervical lymphadenopathy.   ------  I have personally reviewed the radiology report from [facility] at [facility]. 03/14/22  XR Knee 3 Views Left  Anatomical Region Laterality Modality  Knee -- Computed Radiography   Narrative  This result has an attachment that is not available. TECHNIQUE:  XR KNEE 3 VIEWS LEFT  COMPARISON:   None.  INDICATION: Pain in left knee    FINDINGS: Severe degenerative narrowing of the medial compartment with periarticular sclerosis, and mild to moderate osteophyte formation. Moderate narrowing of the lateral  compartment, especially near the intracondylar notch. No significant lateral osteophytes. Moderately severe narrowing of the patellofemoral compartment with moderate osteophyte formation. Bony densities projects in the anterior and posterior knee, potentially intra-articular loose bodies. Small suprapatellar joint effusion. No evidence of fracture. Procedure Note  Draghetti, Dallas Breeding, MD - 03/15/2022 Formatting of this note might be different from  the original. TECHNIQUE:  XR KNEE 3 VIEWS LEFT  COMPARISON:   None.  INDICATION: Pain in left knee    FINDINGS: Severe degenerative narrowing of the medial compartment with periarticular sclerosis, and mild to moderate osteophyte formation. Moderate narrowing of the lateral compartment, especially near the intracondylar notch. No significant lateral osteophytes. Moderately severe narrowing of the patellofemoral compartment with moderate osteophyte formation. Bony densities projects in the anterior and posterior knee, potentially intra-articular loose bodies. Small suprapatellar joint effusion. No evidence of fracture.    IMPRESSION:  Degenerative arthritis of the left knee as described. Kellgren and Lawrence: grade 3-4  Other findings as above.   Electronically Signed by: Rodman Key Draghetti on 03/15/2022 8:29 AM   ------------------  XR Knee 3 Views Right  Anatomical Region Laterality Modality  Thigh -- Computed Radiography  Knee -- --  Leg -- --   Impression  IMPRESSION:  Severe patellofemoral predominant osteoarthrosis. No acute findings.  Electronically Signed by: Florence Canner, MD on 03/15/2022 8:42 AM Narrative  This result has an attachment that is not available. EXAM: XR KNEE 3 VIEWS RIGHT DATE: 03/14/2022 10:14 AM ACCESSION: [id number] DICTATED: 03/15/2022 8:41 AM  CLINICAL INDICATION: 51 years old Male with Pain in right knee    COMPARISON: None  TECHNIQUE: 3 views right knee  FINDINGS:  No acute fracture or  dislocation. Moderate medial tibiofemoral joint space narrowing and severe patellofemoral joint space narrowing and osteophytosis, tricompartment no aggressive osseous lesion. No soft tissue abnormality. Procedure Note  Meliton Rattan, MD - 03/15/2022 Formatting of this note might be different from the original. EXAM: XR KNEE 3 VIEWS RIGHT DATE: 03/14/2022 10:14 AM ACCESSION: [id number] DICTATED: 03/15/2022 8:41 AM  CLINICAL INDICATION: 51 years old Male with Pain in right knee    COMPARISON: None  TECHNIQUE: 3 views right knee  FINDINGS:  No acute fracture or dislocation. Moderate medial tibiofemoral joint space narrowing and severe patellofemoral joint space narrowing and osteophytosis, tricompartment no aggressive osseous lesion. No soft tissue abnormality.    IMPRESSION:  Severe patellofemoral predominant osteoarthrosis. No acute findings.    Results for orders placed or performed in visit on 04/12/22  Protein / creatinine ratio, urine  Result Value Ref Range   Creatinine, Urine 154.2 Not Estab. mg/dL   Protein, Ur 92.8 Not Estab. mg/dL   Protein/Creat Ratio 602 (H) 0 - 200 mg/g creat  Protein Electrophoresis, (serum)  Result Value Ref Range   Total Protein 7.0 6.0 - 8.5 g/dL   Albumin ELP 3.7 2.9 - 4.4 g/dL   Alpha 1 0.2 0.0 - 0.4 g/dL   Alpha 2 0.8 0.4 - 1.0 g/dL   Beta 1.2 0.7 - 1.3 g/dL   Gamma Globulin 1.1 0.4 - 1.8 g/dL   M-Spike, % Not Observed Not Observed g/dL   Globulin, Total 3.3 2.2 - 3.9 g/dL   A/G Ratio 1.1 0.7 - 1.7   Please Note: Comment    Interpretation: Comment   Rheumatoid Factor  Result Value Ref Range   Rhuematoid fact SerPl-aCnc <10.0 <14.0 IU/mL  HLA-B27 Antigen  Result Value Ref Range   HLA B27 Negative   Specimen status report  Result Value Ref Range   specimen status report Comment       Assessment & Plan:   Problem List Items Addressed This Visit     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   Relevant Orders   Ambulatory  referral to Orthopedic Surgery   Chronic bilateral low back pain with bilateral sciatica  Relevant Medications   DULoxetine (CYMBALTA) 30 MG capsule   tiZANidine (ZANAFLEX) 4 MG tablet   Other Relevant Orders   Ambulatory referral to Orthopedic Surgery   Chronic pain of both shoulders   Relevant Medications   DULoxetine (CYMBALTA) 30 MG capsule   tiZANidine (ZANAFLEX) 4 MG tablet   Other Relevant Orders   Ambulatory referral to Orthopedic Surgery   Chronic pain of left knee   Relevant Medications   DULoxetine (CYMBALTA) 30 MG capsule   tiZANidine (ZANAFLEX) 4 MG tablet   Other Relevant Orders   Ambulatory referral to Orthopedic Surgery   DDD (degenerative disc disease), lumbar   Relevant Medications   DULoxetine (CYMBALTA) 30 MG capsule   tiZANidine (ZANAFLEX) 4 MG tablet   Other Relevant Orders   Ambulatory referral to Orthopedic Surgery   Morbid obesity with BMI of 45.0-49.9, adult (HCC)   Relevant Medications   metFORMIN (GLUCOPHAGE) 500 MG tablet   Pre-diabetes   Relevant Medications   metFORMIN (GLUCOPHAGE) 500 MG tablet   Primary osteoarthritis involving multiple joints   Relevant Medications   DULoxetine (CYMBALTA) 30 MG capsule   tiZANidine (ZANAFLEX) 4 MG tablet   Other Relevant Orders   Ambulatory referral to Orthopedic Surgery   Primary osteoarthritis of left knee   Relevant Medications   DULoxetine (CYMBALTA) 30 MG capsule   tiZANidine (ZANAFLEX) 4 MG tablet   Other Relevant Orders   Ambulatory referral to Orthopedic Surgery   Other Visit Diagnoses     Rotator cuff impingement syndrome of left shoulder       Relevant Orders   Ambulatory referral to Orthopedic Surgery       Returned to care after >[facility] Now has med[facility] disability coverage  Complex case with generalized osteoarthritis, severe multiple joints impacting his disability and function, with limited treatment thus far.  PreDM Restart Metformin 544m  daily  Ordered new meds Muscle Relaxant = TIzanidine 473m(Zanaflex) as needed up to 3 times a day or 8 hours, can help rest and help pain on regular basis  Ordered new med anti depressant / pain med = Duloxetine (Cymbalta) 3099maily in morning, take with meal. It will take a few weeks to kick in for effect, can help control mood. And also should help improve chronic pain, dial it down.  referral to Orthopedics for Right clavicular mass of bone, and multiple severe advanced osteoarthritis DJD including L knee worse than R, low back lumbar DDD spinal, and bilateral shoulders. 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   [facility]  [id number]: [phone] Placed This Encounter  Procedures   Ambulatory referral to Orthopedic Surgery    Referral Priority:   Routine    Referral Type:   Surgical    Referral Reason:   Specialty Services Required    Requested Specialty:   Orthopedic Surgery    Number of Visits Requested:   1      Meds ordered this encounter  Medications   DULoxetine (CYMBALTA) 30 MG capsule    Sig: Take 1 capsule (30 mg total) by mouth daily.    Dispense:  30 capsule    Refill:  2   tiZANidine (ZANAFLEX) 4 MG tablet    Sig: Take 1 tablet (4 mg total)  by mouth every 8 (eight) hours as needed for muscle spasms.    Dispense:  90 tablet    Refill:  2   metFORMIN (GLUCOPHAGE) 500 MG tablet    Sig: Take 1 tablet (500 mg total) by mouth daily with breakfast.    Dispense:  90 tablet    Refill:  3      Follow up plan: Return in about 6 weeks (around 08/23/2022) for 6 weeks follow-up arthritis joint pain, mood, med adjust.   Nobie Putnam, DO [facility] 07/12/2022,  11:07 AM

## 2022-07-12 NOTE — Patient Instructions (Addendum)
Thank you for coming to the office today.  Restart Metformin '500mg'$  daily  Ordered new meds Muscle Relaxant = TIzanidine '4mg'$  (Zanaflex) as needed up to 3 times a day or 8 hours, can help rest and help pain on regular basis  Ordered new med anti depressant / pain med = Duloxetine (Cymbalta) '30mg'$  daily in morning, take with meal. It will take a few weeks to kick in for effect, can help control mood. And also should help improve chronic pain, dial it down.  Referral submitted to Orthopedics again. If they do not call you back within 2 weeks, then you can call them to schedule.  [facility] [place], [place]  [address] Phone: [phone]    Please schedule a Follow-up Appointment to: Return in about 6 weeks (around 08/23/2022) for 6 weeks follow-up arthritis joint pain, mood, med adjust.  If you have any other questions or concerns, please feel free to call the office or send a message through [place]. You may also schedule an earlier appointment if necessary.  Additionally, you may be receiving a survey about your experience at our office within a few days to 1 week by e-mail or mail. We value your feedback.  Nobie Putnam, DO [place]

## 2022-07-13 ENCOUNTER — Ambulatory Visit: Payer: Medicaid Other | Admitting: Gerontology

## 2022-07-22 ENCOUNTER — Other Ambulatory Visit: Payer: Self-pay

## 2022-07-22 IMAGING — CR DG CHEST 2V
1 series · 2 of 2 positions shown · non-contrast
Comparison: May 19, 2006

CLINICAL DATA: Shortness of breath and dizziness.

EXAM:
CHEST - 2 VIEW

[Series 1: dg chest 2 view · 0.14mm/px · 2 of 2 slices shown]
[im 1/2]
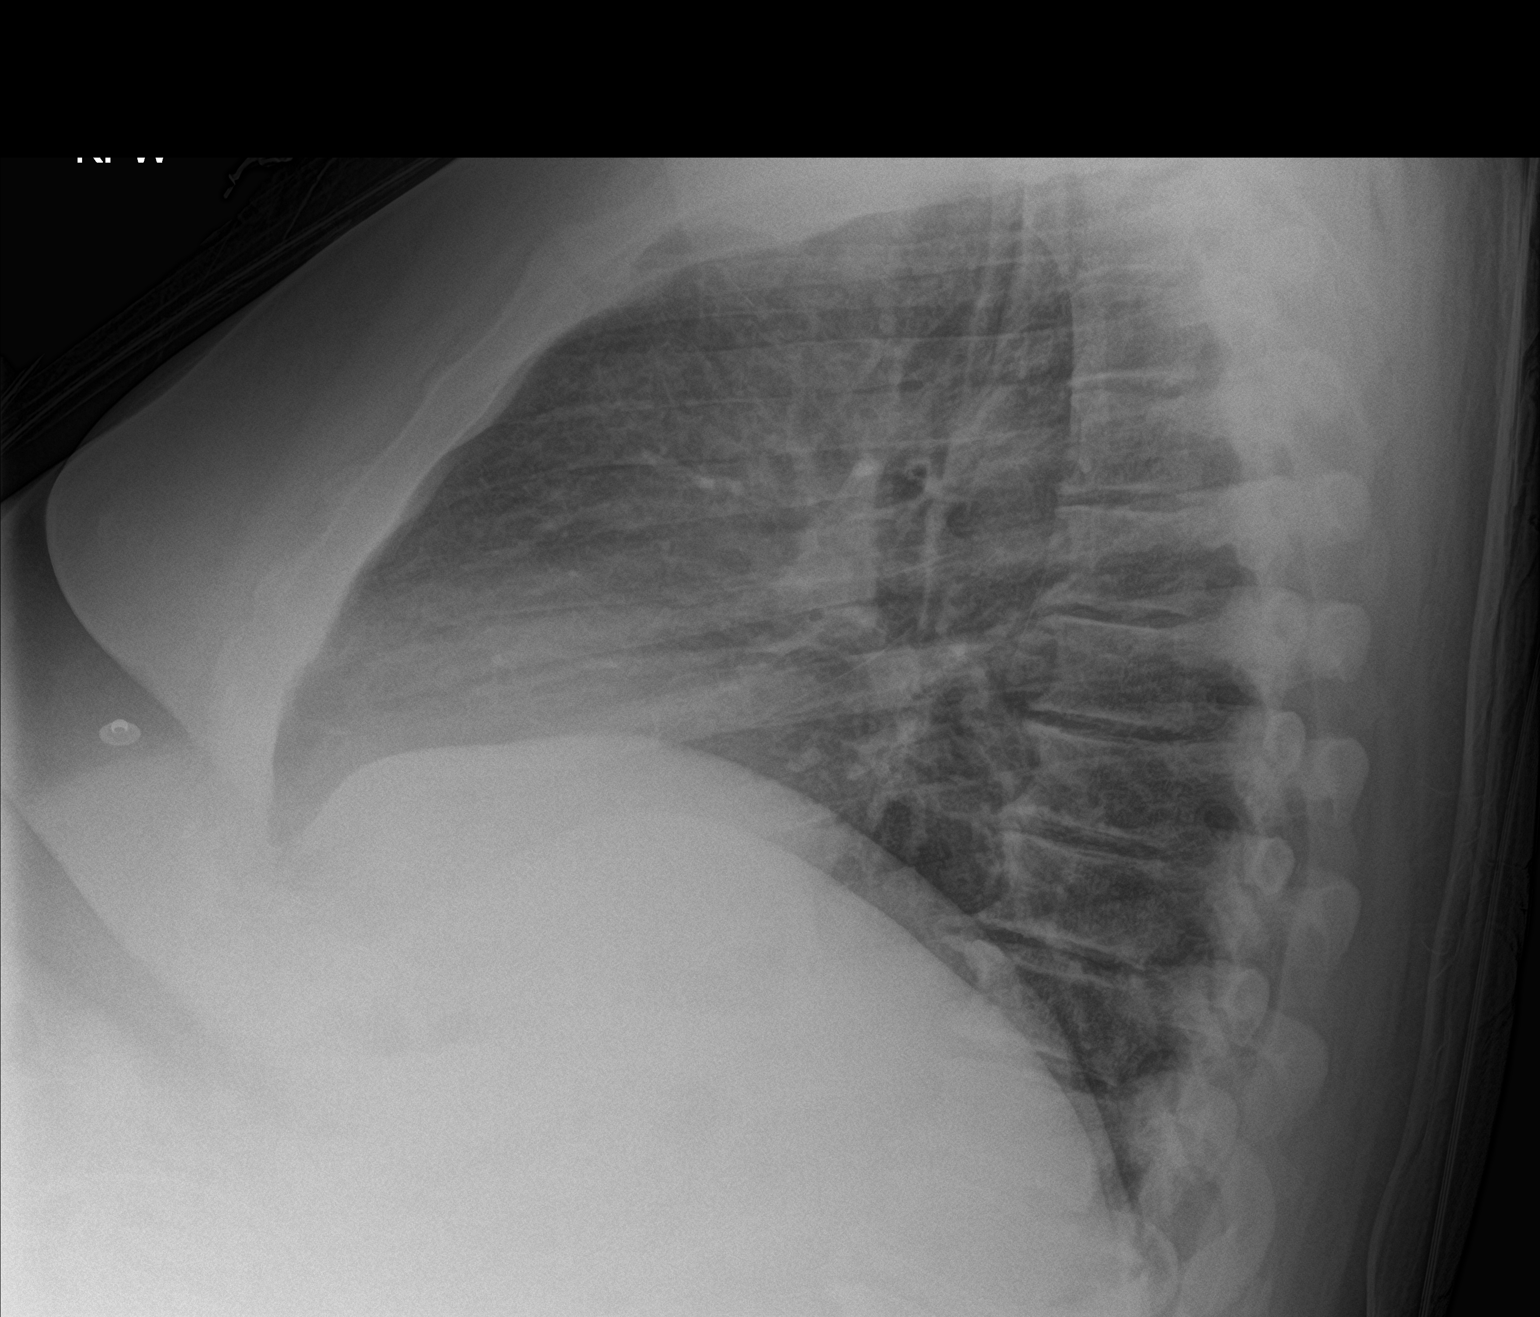
[im 2/2]
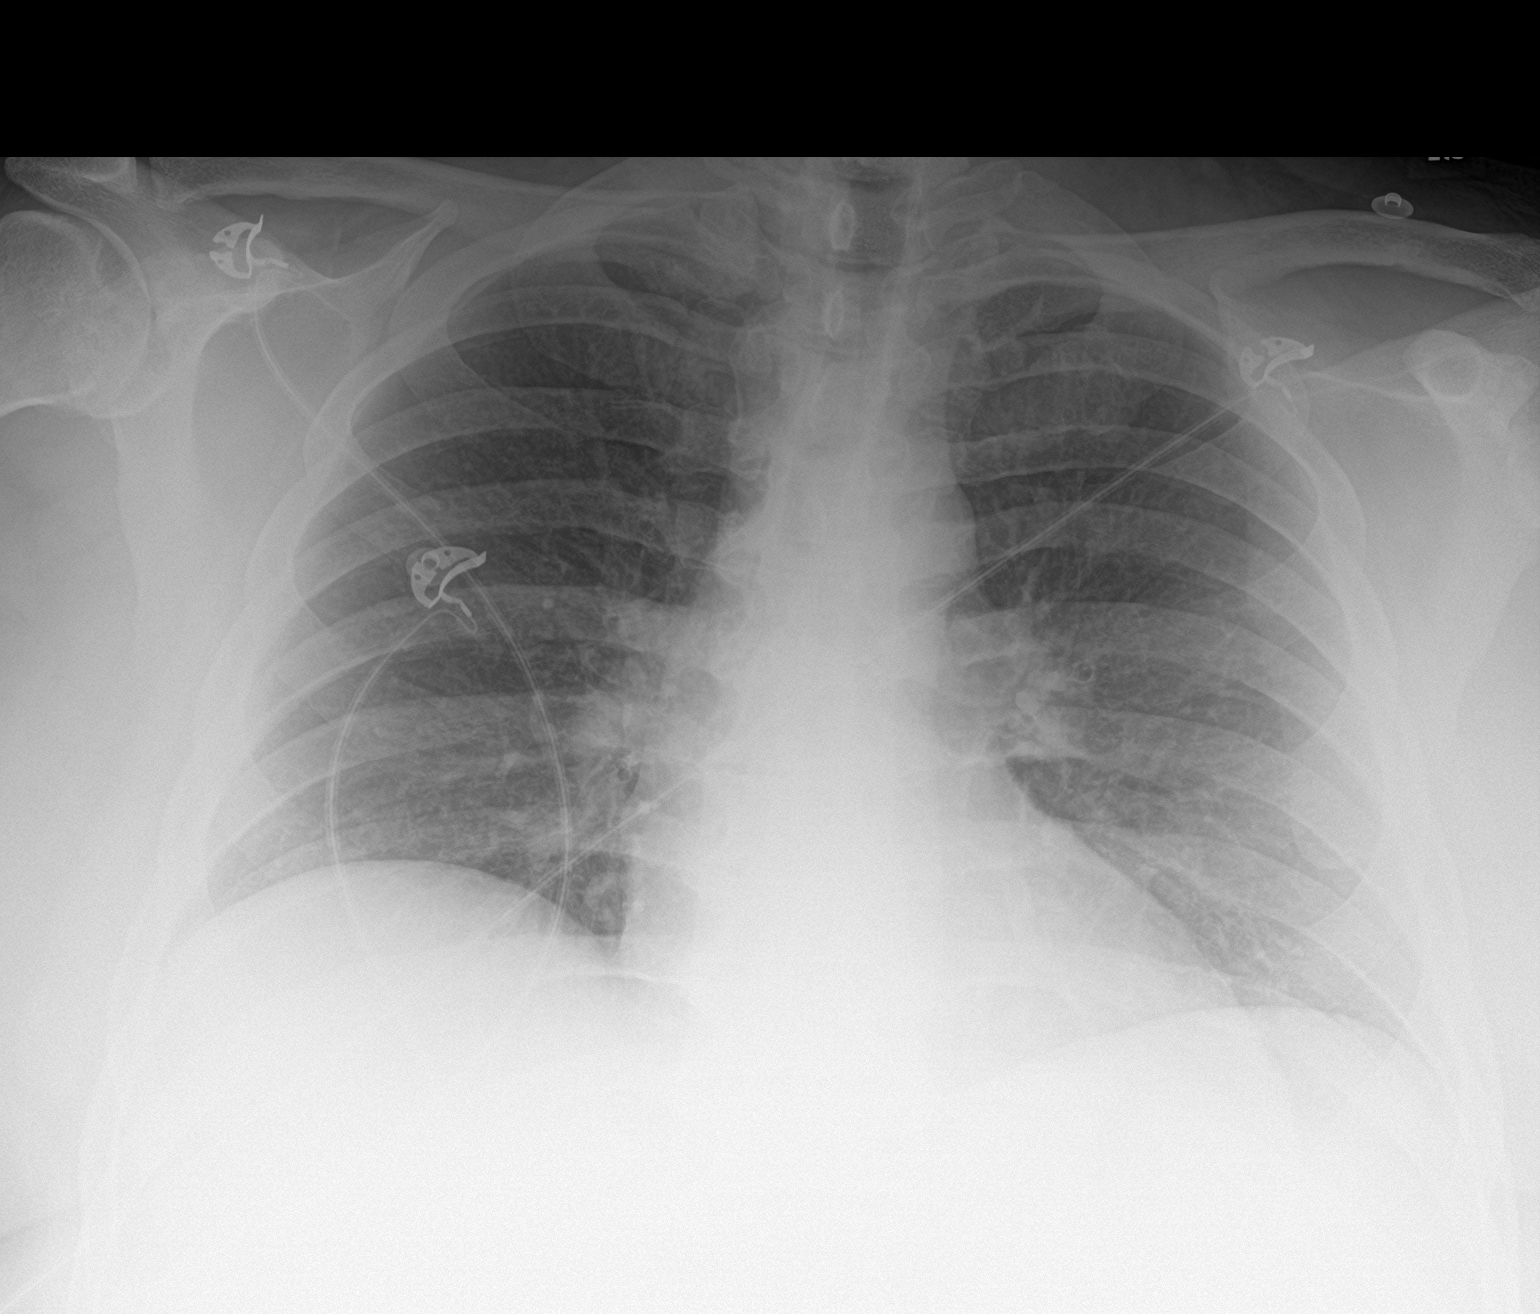

[2 of 2 positions shown; findings below may reference images not displayed]

FINDINGS: The heart size and mediastinal contours are within normal limits.
Both lungs are clear. The visualized skeletal structures are
unremarkable.
IMPRESSION: No active cardiopulmonary disease.

## 2022-08-01 ENCOUNTER — Other Ambulatory Visit: Payer: Self-pay

## 2022-08-01 DIAGNOSIS — G8929 Other chronic pain: Secondary | ICD-10-CM | POA: Diagnosis not present

## 2022-08-01 DIAGNOSIS — M4807 Spinal stenosis, lumbosacral region: Secondary | ICD-10-CM | POA: Diagnosis not present

## 2022-08-01 DIAGNOSIS — M5441 Lumbago with sciatica, right side: Secondary | ICD-10-CM | POA: Diagnosis not present

## 2022-08-01 DIAGNOSIS — M5442 Lumbago with sciatica, left side: Secondary | ICD-10-CM | POA: Diagnosis not present

## 2022-09-20 ENCOUNTER — Other Ambulatory Visit: Payer: Self-pay

## 2022-10-26 ENCOUNTER — Encounter: Payer: Self-pay | Admitting: Family Medicine

## 2022-10-26 ENCOUNTER — Ambulatory Visit: Payer: Medicaid Other | Admitting: Family Medicine

## 2022-10-26 ENCOUNTER — Ambulatory Visit: Payer: Self-pay | Admitting: *Deleted

## 2022-10-26 VITALS — BP 116/72 | HR 97 | Temp 96.6°F | Wt 368.0 lb

## 2022-10-26 DIAGNOSIS — K047 Periapical abscess without sinus: Secondary | ICD-10-CM

## 2022-10-26 DIAGNOSIS — R7303 Prediabetes: Secondary | ICD-10-CM

## 2022-10-26 DIAGNOSIS — Z6841 Body Mass Index (BMI) 40.0 and over, adult: Secondary | ICD-10-CM | POA: Diagnosis not present

## 2022-10-26 MED ORDER — AMOXICILLIN-POT CLAVULANATE 875-125 MG PO TABS: 1.0000 | ORAL_TABLET | Freq: Two times a day (BID) | ORAL | 0 refills | Status: DC

## 2022-10-26 MED ORDER — METFORMIN HCL ER 500 MG PO TB24: 500.0000 mg | ORAL_TABLET | Freq: Every day | ORAL | 3 refills | Status: DC

## 2022-10-26 NOTE — Patient Instructions (Addendum)
Thank you for coming to the office today.  Stop Metformin immediate release Start the new Metformin Extended release, once a day with a meal. Should have less diarrhea.  Ordered antibiotic Augmentin to [facility] for the dental infection.  Likely the leg symptoms are just swelling fluid retention gravity dependent.  We can order fluid pill if you need or if it is worsening, just call back. Otherwise I would do the elevation  Use RICE therapy: - R - Rest / relative rest with activity modification avoid overuse of joint - I - Ice packs (make sure you use a towel or sock / something to protect skin) - C - Compression with ACE wrap to apply pressure and reduce swelling allowing more support - E - Elevation - if significant swelling, lift leg above heart level (toes above your nose) to help reduce swelling, most helpful at night after day of being on your feet  Labs today for sugar and chemistry.  Please schedule a Follow-up Appointment to: Return if symptoms worsen or fail to improve.  If you have any other questions or concerns, please feel free to call the office or send a message through [place]. You may also schedule an earlier appointment if necessary.  Additionally, you may be receiving a survey about your experience at our office within a few days to 1 week by e-mail or mail. We value your feedback.  Nobie Putnam, DO [place]

## 2022-10-26 NOTE — Telephone Encounter (Signed)
  Chief Complaint: Abscessed tooth, in pain, requesting an antibiotic until can be seen by dentist Symptoms: Swollen area inside his mouth and a little on the outer part.   Involves the 2nd and 3rd molar on the left bottom of his mouth.    C/o a lot of pain.   Requesting an antibiotic for the infection before he sees a dentist.   Knows a dentist his daughter goes to who accepts Medicaid he is going to establish with. Frequency: Pain started yesterday Pertinent Negatives: Patient denies Fever, body aches or chills. Disposition: []$ ED /[]$ Urgent Care (no appt availability in office) / [x]$ Appointment(In office/virtual)/ []$  [facility]/ []$ Home Care/ []$ Refused Recommended Disposition /[]$ [facility]/ []$  Follow-up with PCP Additional Notes: Appt. Made for today with Dr. Parks Ranger at 1:40.

## 2022-10-26 NOTE — Progress Notes (Signed)
Subjective:    Patient ID: Sean Blake, male    DOB: Mar 25, 1971, 52 y.o.   MRN: [id number]  Sean Blake is a 52 y.o. male presenting on 10/26/2022 for Dental Pain   HPI  Left Lower Dental Abscess Ruptured overnight drainage of pus Has persistent pain swelling Left lower molar Prior antibiotics Waiting to get in to dentist Denies fever  Pre-Diabetes Prior reading [address]23 Tries to avoid bread cake dessert But he takes Metformin on occasion. He has side effect with diarrhea. Taking Metformin '500mg'$  daily AS NEEDED taking 2-3 days a week max  Chronic Low Back Pain Lumbar DDD Followed by Orthopedics They ordered MRI but it was denied, he needs weeks of therapy, he will follow up with them.      10/26/2022    2:22 PM 07/12/2022   11:29 AM 06/15/2022   11:20 AM  Depression screen PHQ 2/9  Decreased Interest '2 1 3  '$ Down, Depressed, Hopeless '1 2 3  '$ PHQ - 2 Score '3 3 6  '$ Altered sleeping '3 3 3  '$ Tired, decreased energy '2 2 3  '$ Change in appetite 2 2 0  Feeling bad or failure about yourself  '3 2 2  '$ Trouble concentrating '2 3 3  '$ Moving slowly or fidgety/restless 0 1 0  Suicidal thoughts 1 1 0  PHQ-9 Score '16 17 17  '$ Difficult doing work/chores Very difficult Somewhat difficult Very difficult          10/26/2022    2:23 PM 07/12/2022   11:29 AM 06/15/2022   11:20 AM 04/20/2022    3:06 PM  GAD 7 : Generalized Anxiety Score  Nervous, Anxious, on Edge 0 '1 2 2  '$ Control/stop worrying '1 2 3 3  '$ Worry too much - different things '1 2 3 3  '$ Trouble relaxing '1 1 3 3  '$ Restless 0 '1 2 2  '$ Easily annoyed or irritable '3 2 3 3  '$ Afraid - awful might happen 0 '1 3 2  '$ Total GAD 7 Score '6 10 19 18  '$ Anxiety Difficulty Not difficult at all Not difficult at all Very difficult Somewhat difficult      Social History   Tobacco Use   Smoking status: Every Day    Packs/day: 0.25    Years: 30.00    Total pack years: 7.50    Types: Cigarettes   Smokeless tobacco: Never  Vaping Use    Vaping Use: Never used  Substance Use Topics   Alcohol use: Yes    Comment: 1-2 glasses of liquor 2 times per month   Drug use: Yes    Types: Marijuana    Comment: last use 03/2022    Review of Systems Per HPI unless specifically indicated above     Objective:    BP 116/72   Pulse 97   Temp (!) 96.6 F (35.9 C) (Temporal)   Wt (!) 368 lb (166.9 kg)   SpO2 97%   BMI 49.91 kg/m   Wt Readings from Last 3 Encounters:  10/26/22 (!) 368 lb (166.9 kg)  07/12/22 (!) 358 lb (162.4 kg)  04/18/22 (!) 339 lb (153.8 kg)    Physical Exam Vitals and nursing note reviewed.  Constitutional:      General: He is not in acute distress.    Appearance: He is well-developed. He is obese. He is not diaphoretic.     Comments: Well-appearing, comfortable, cooperative  HENT:     Head: Normocephalic and atraumatic.     Mouth/Throat:  Comments: Left lower molar dental decay gum inflammation Eyes:     General:        Right eye: No discharge.        Left eye: No discharge.     Conjunctiva/sclera: Conjunctivae normal.  Neck:     Thyroid: No thyromegaly.  Cardiovascular:     Rate and Rhythm: Normal rate and regular rhythm.     Pulses: Normal pulses.     Heart sounds: Normal heart sounds. No murmur heard. Pulmonary:     Effort: Pulmonary effort is normal. No respiratory distress.     Breath sounds: Normal breath sounds. No wheezing or rales.  Musculoskeletal:        General: Normal range of motion.     Cervical back: Normal range of motion and neck supple.  Lymphadenopathy:     Cervical: No cervical adenopathy.  Skin:    General: Skin is warm and dry.     Findings: No erythema or rash.  Neurological:     Mental Status: He is alert and oriented to person, place, and time. Mental status is at baseline.  Psychiatric:        Behavior: Behavior normal.     Comments: Well groomed, good eye contact, normal speech and thoughts    Results for orders placed or performed in visit on 04/12/22   Protein / creatinine ratio, urine  Result Value Ref Range   Creatinine, Urine 154.2 Not Estab. mg/dL   Protein, Ur 92.8 Not Estab. mg/dL   Protein/Creat Ratio 602 (H) 0 - 200 mg/g creat  Protein Electrophoresis, (serum)  Result Value Ref Range   Total Protein 7.0 6.0 - 8.5 g/dL   Albumin ELP 3.7 2.9 - 4.4 g/dL   Alpha 1 0.2 0.0 - 0.4 g/dL   Alpha 2 0.8 0.4 - 1.0 g/dL   Beta 1.2 0.7 - 1.3 g/dL   Gamma Globulin 1.1 0.4 - 1.8 g/dL   M-Spike, % Not Observed Not Observed g/dL   Globulin, Total 3.3 2.2 - 3.9 g/dL   A/G Ratio 1.1 0.7 - 1.7   Please Note: Comment    Interpretation: Comment   Rheumatoid Factor  Result Value Ref Range   Rhuematoid fact SerPl-aCnc <10.0 <14.0 IU/mL  HLA-B27 Antigen  Result Value Ref Range   HLA B27 Negative   Specimen status report  Result Value Ref Range   specimen status report Comment       Assessment & Plan:   Problem List Items Addressed This Visit     Morbid obesity with BMI of 45.0-49.9, adult (HCC)   Relevant Medications   metFORMIN (GLUCOPHAGE-XR) 500 MG 24 hr tablet   Other Relevant Orders   COMPLETE METABOLIC PANEL WITH GFR   Hemoglobin A1c   CBC with Differential/Platelet   Pre-diabetes   Relevant Medications   metFORMIN (GLUCOPHAGE-XR) 500 MG 24 hr tablet   Other Relevant Orders   COMPLETE METABOLIC PANEL WITH GFR   Hemoglobin A1c   CBC with Differential/Platelet   Other Visit Diagnoses     Dental abscess    -  Primary   Relevant Medications   amoxicillin-clavulanate (AUGMENTIN) 875-125 MG tablet       Stop Metformin immediate release Start the new Metformin Extended release, once a day with a meal. Should have less diarrhea.  Ordered antibiotic Augmentin to [facility] for the dental infection.  Likely the leg symptoms are just swelling fluid retention gravity dependent.  We can order fluid pill if you need or  if it is worsening, just call back. Otherwise I would do the elevation  Use RICE therapy: - R -  Rest / relative rest with activity modification avoid overuse of joint - I - Ice packs (make sure you use a towel or sock / something to protect skin) - C - Compression with ACE wrap to apply pressure and reduce swelling allowing more support - E - Elevation - if significant swelling, lift leg above heart level (toes above your nose) to help reduce swelling, most helpful at night after day of being on your feet  Labs today for sugar and chemistry.  He should follow with Orthopedics for management of his chronic back pain / spine I advised pursue the PT as requested and if unsuccessful they can document and maybe get MRI approved   Meds ordered this encounter  Medications   DISCONTD: amoxicillin-clavulanate (AUGMENTIN) 875-125 MG tablet    Sig: Take 1 tablet by mouth 2 (two) times daily.    Dispense:  20 tablet    Refill:  0   amoxicillin-clavulanate (AUGMENTIN) 875-125 MG tablet    Sig: Take 1 tablet by mouth 2 (two) times daily.    Dispense:  20 tablet    Refill:  0   metFORMIN (GLUCOPHAGE-XR) 500 MG 24 hr tablet    Sig: Take 1 tablet (500 mg total) by mouth daily with breakfast.    Dispense:  90 tablet    Refill:  3      Follow up plan: Return if symptoms worsen or fail to improve.  Nobie Putnam, [facility] 10/26/2022, 2:00 PM

## 2022-10-26 NOTE — Telephone Encounter (Signed)
Message from Sean Blake sent at 10/26/2022  8:08 AM EST  Summary: abscess on tooth   Pt states that he has an abscess on his tooth and is in a lot of pain and wants to know if PCP can call him in some amoxicillin until he can get to the dentist.  Please advise          Call History   Type Contact Phone/Fax User  10/26/2022 08:07 AM EST Phone (Incoming) Brien, Wilkinson (Self) [phone] Jerilynn Mages) Allred, [place]ze   Reason for Disposition  [1] Face is swollen AND [2] no fever  Answer Assessment - Initial Assessment Questions 1. LOCATION: "Which tooth is hurting?"  (e.g., right-side/left-side, upper/lower, front/back)     I have an abscess in my molar in the back.   It's my 2 and 3 molar on the back on the left side and it's on bottom.    I'm swollen this morning and  throat is sore.   Can he call in some Amoxicillin for this?    I've had several abscesses over my life time.   I will call the dentist once this swelling is down.  My daughter goes to a dentist that accepts Medicaid so I'm going to go there. 2. ONSET: "When did the toothache start?"  (e.g., hours, days)      Started yesterday. 3. SEVERITY: "How bad is the toothache?"  (Scale 1-10; mild, moderate or severe)   - MILD (1-3): doesn't interfere with chewing    - MODERATE (4-7): interferes with chewing, interferes with normal activities, awakens from sleep     - SEVERE (8-10): unable to eat, unable to do any normal activities, excruciating pain        Yesterday 9/10 pain 4. SWELLING: "Is there any visible swelling of your face?"     Yes inside my mouth and a little on the outside. 5. OTHER SYMPTOMS: "Do you have any other symptoms?" (e.g., fever)     Sore throat, I feel warm but no chills. 6. PREGNANCY: "Is there any chance you are pregnant?" "When was your last menstrual period?"     N/A  Protocols used: Toothache-A-AH

## 2023-05-19 ENCOUNTER — Ambulatory Visit: Payer: Self-pay

## 2023-05-19 NOTE — Telephone Encounter (Signed)
Chief Complaint: I want to discuss weight loss medication Symptoms: Pain in back and knee chronic pain that I've been dealing with for years and losing weight may help that pain Frequency: N/A Pertinent Negatives: Patient denies other symptoms Disposition: [] ED /[] Urgent Care (no appt availability in office) / [x] Appointment(In office/virtual)/ []  [facility]/ [] Home Care/ [] Refused Recommended Disposition /[] [facility]/ []  Follow-up with PCP Additional Notes: Patient says he wants to start on weight loss medication. He says he's been dealing with back and knee pain for years and it's not getting better. He says if he lost weight, he feels that would help. I advised he will need a visit to discuss with the provider. He says that he needs to be seen before 06/06/23 because he will be out of town. Advised to see Rene Kocher on 05/26/23, he agrees.  Summary: needs appt   Pt would like appt w/ Dr Kirtland Bouchard to address weight loss. Pain in back and legs, and something for his arthritis. First appt is 10/08.  He doesn't want to weight that long.          Reason for Disposition  Health Information question, no triage required and triager able to answer question  Answer Assessment - Initial Assessment Questions 1. REASON FOR CALL or QUESTION: "What is your reason for calling today?" or "How can I best help you?" or "What question do you have that I can help answer?"     I want to discuss weight loss medication  Protocols used: Information Only Call - No Triage-A-AH

## 2023-05-26 ENCOUNTER — Ambulatory Visit: Payer: Medicaid Other | Admitting: Internal Medicine

## 2023-05-26 NOTE — Progress Notes (Deleted)
Subjective:    Patient ID: Sean Blake, male    DOB: 10-Jul-1971, 52 y.o.   MRN: [id number]  HPI    Review of Systems     Past Medical History:  Diagnosis Date   Anxiety    Arthritis     Current Outpatient Medications  Medication Sig Dispense Refill   amoxicillin-clavulanate (AUGMENTIN) 875-125 MG tablet Take 1 tablet by mouth 2 (two) times daily. 20 tablet 0   DULoxetine (CYMBALTA) 30 MG capsule Take 1 capsule (30 mg total) by mouth daily. 30 capsule 2   ibuprofen (ADVIL) 200 MG tablet Take 200 mg by mouth every 6 (six) hours as needed.     metFORMIN (GLUCOPHAGE-XR) 500 MG 24 hr tablet Take 1 tablet (500 mg total) by mouth daily with breakfast. 90 tablet 3   tiZANidine (ZANAFLEX) 4 MG tablet Take 1 tablet (4 mg total) by mouth every 8 (eight) hours as needed for muscle spasms. 90 tablet 2   No current facility-administered medications for this visit.    Allergies  Allergen Reactions   Gabapentin Palpitations and Other (See Comments)    convulsions    Family History  Problem Relation Age of Onset   Arthritis Mother    Other Father        unknown medical history   Arthritis Maternal Grandmother    Hypertension Maternal Grandmother    Heart attack Maternal Grandfather    Other Paternal Grandmother        unknown medical history   Arthritis Paternal Grandfather    Alzheimer's disease Paternal Grandfather     Social History   Socioeconomic History   Marital status: Legally Separated    Spouse name: Not on file   Number of children: Not on file   Years of education: Not on file   Highest education level: Not on file  Occupational History   Not on file  Tobacco Use   Smoking status: Every Day    Current packs/day: 0.25    Average packs/day: 0.3 packs/day for 30.0 years (7.5 ttl pk-yrs)    Types: Cigarettes   Smokeless tobacco: Never  Vaping Use   Vaping status: Never Used  Substance and Sexual Activity   Alcohol use: Yes    Comment: 1-2 glasses of  liquor 2 times per month   Drug use: Yes    Types: Marijuana    Comment: last use 03/2022   Sexual activity: Not on file  Other Topics Concern   Not on file  Social History Narrative   Not on file   Social Determinants of Health   Financial Resource Strain: High Risk (04/15/2021)   Overall Financial Resource Strain (CARDIA)    Difficulty of Paying Living Expenses: Very hard  Food Insecurity: No Food Insecurity (09/15/2021)   Hunger Vital Sign    Worried About Running Out of Food in the Last Year: Never true    Ran Out of Food in the Last Year: Never true  Transportation Needs: No Transportation Needs (09/15/2021)   PRAPARE - Administrator, Civil Service (Medical): No    Lack of Transportation (Non-Medical): No  Physical Activity: Inactive (04/15/2021)   Exercise Vital Sign    Days of Exercise per Week: 0 days    Minutes of Exercise per Session: 0 min  Stress: Stress Concern Present (04/15/2021)   [employer] of Occupational Health - Occupational Stress Questionnaire    Feeling of Stress : Very much  Social Connections: Unknown (03/08/2022)  [facility] from [facility]   Social Network    Social Network: Not on file  Intimate Partner Violence: Unknown (03/08/2022)   Received from [facility], [facility]   HITS    Physically Hurt: Not on file    Insult or Talk Down To: Not on file    Threaten Physical Harm: Not on file    Scream or Curse: Not on file     Constitutional: Denies fever, malaise, fatigue, headache or abrupt weight changes.  HEENT: Denies eye pain, eye redness, ear pain, ringing in the ears, wax buildup, runny nose, nasal congestion, bloody nose, or sore throat. Respiratory: Denies difficulty breathing, shortness of breath, cough or sputum production.   Cardiovascular: Denies chest pain, chest tightness, palpitations or swelling in the hands or feet.  Gastrointestinal: Denies abdominal pain, bloating, constipation, diarrhea or  blood in the stool.  GU: Denies urgency, frequency, pain with urination, burning sensation, blood in urine, odor or discharge. Musculoskeletal: Patient reports chronic joint pain.  Denies decrease in range of motion, difficulty with gait, muscle pain or joint swelling.  Skin: Denies redness, rashes, lesions or ulcercations.  Neurological: Denies dizziness, difficulty with memory, difficulty with speech or problems with balance and coordination.  Psych: Patient has a history of anxiety and depression.  Denies SI/HI.  No other specific complaints in a complete review of systems (except as listed in HPI above).  Objective:   Physical Exam   There were no vitals taken for this visit. Wt Readings from Last 3 Encounters:  10/26/22 (!) 368 lb (166.9 kg)  07/12/22 (!) 358 lb (162.4 kg)  04/18/22 (!) 339 lb (153.8 kg)    General: Appears their stated age, well developed, well nourished in NAD. Skin: Warm, dry and intact. No rashes, lesions or ulcerations noted. HEENT: Head: normal shape and size; Eyes: sclera white, no icterus, conjunctiva pink, PERRLA and EOMs intact; Ears: Tm's gray and intact, normal light reflex; Nose: mucosa pink and moist, septum midline; Throat/Mouth: Teeth present, mucosa pink and moist, no exudate, lesions or ulcerations noted.  Neck:  Neck supple, trachea midline. No masses, lumps or thyromegaly present.  Cardiovascular: Normal rate and rhythm. S1,S2 noted.  No murmur, rubs or gallops noted. No JVD or BLE edema. No carotid bruits noted. Pulmonary/Chest: Normal effort and positive vesicular breath sounds. No respiratory distress. No wheezes, rales or ronchi noted.  Abdomen: Soft and nontender. Normal bowel sounds. No distention or masses noted. Liver, spleen and kidneys non palpable. Musculoskeletal: Normal range of motion. No signs of joint swelling. No difficulty with gait.  Neurological: Alert and oriented. Cranial nerves II-XII grossly intact. Coordination normal.   Psychiatric: Mood and affect normal. Behavior is normal. Judgment and thought content normal.    BMET    Component Value Date/Time   NA 138 03/09/2022 0950   K 4.7 03/09/2022 0950   CL 103 03/09/2022 0950   CO2 21 03/09/2022 0950   GLUCOSE 95 03/09/2022 0950   GLUCOSE 102 (H) 05/19/2021 1445   BUN 23 03/09/2022 0950   CREATININE 0.87 03/09/2022 0950   CALCIUM 9.3 03/09/2022 0950   GFRNONAA 57 (L) 05/19/2021 1445   GFRAA >60 05/29/2018 1047    Lipid Panel     Component Value Date/Time   CHOL 218 (H) 03/09/2022 0950   TRIG 177 (H) 03/09/2022 0950   HDL 47 03/09/2022 0950   CHOLHDL 4.6 03/09/2022 0950   LDLCALC 139 (H) 03/09/2022 0950    CBC    Component  Value Date/Time   WBC 8.8 03/09/2022 0950   WBC 11.7 (H) 05/19/2021 1445   RBC 5.22 03/09/2022 0950   RBC 4.86 05/19/2021 1445   HGB 16.3 03/09/2022 0950   HCT 48.0 03/09/2022 0950   PLT 318 03/09/2022 0950   MCV 92 03/09/2022 0950   MCH 31.2 03/09/2022 0950   MCH 32.1 05/19/2021 1445   MCHC 34.0 03/09/2022 0950   MCHC 35.1 05/19/2021 1445   RDW 12.8 03/09/2022 0950   LYMPHSABS 2.9 03/09/2022 0950   EOSABS 0.2 03/09/2022 0950   BASOSABS 0.0 03/09/2022 0950    Hgb A1C Lab Results  Component Value Date   HGBA1C 6.1 (H) 03/09/2022           Assessment & Plan:     Follow-up with your PCP as previously scheduled Nicki Reaper, NP

## 2023-07-06 ENCOUNTER — Emergency Department
Admission: EM | Admit: 2023-07-06 | Discharge: 2023-07-06 | Discharge status: Against Medical Advice | Payer: Medicaid Other | Attending: Emergency Medicine | Admitting: Emergency Medicine

## 2023-07-06 ENCOUNTER — Other Ambulatory Visit: Payer: Self-pay

## 2023-07-06 DIAGNOSIS — Z5321 Procedure and treatment not carried out due to patient leaving prior to being seen by health care provider: Secondary | ICD-10-CM | POA: Diagnosis not present

## 2023-07-06 DIAGNOSIS — G8929 Other chronic pain: Secondary | ICD-10-CM | POA: Insufficient documentation

## 2023-07-06 DIAGNOSIS — M545 Low back pain, unspecified: Secondary | ICD-10-CM | POA: Insufficient documentation

## 2023-07-06 NOTE — ED Triage Notes (Signed)
Pt c/o chronic lower back pain that is radiating down his right leg. Pt denies any other medical hx or medication use

## 2023-07-14 ENCOUNTER — Ambulatory Visit (INDEPENDENT_AMBULATORY_CARE_PROVIDER_SITE_OTHER): Payer: Medicaid Other | Admitting: Orthopaedic Surgery

## 2023-07-14 ENCOUNTER — Ambulatory Visit (INDEPENDENT_AMBULATORY_CARE_PROVIDER_SITE_OTHER): Payer: Self-pay

## 2023-07-14 ENCOUNTER — Ambulatory Visit (INDEPENDENT_AMBULATORY_CARE_PROVIDER_SITE_OTHER): Payer: Medicaid Other | Admitting: Sports Medicine

## 2023-07-14 ENCOUNTER — Encounter: Payer: Self-pay | Admitting: Sports Medicine

## 2023-07-14 ENCOUNTER — Encounter: Payer: Self-pay | Admitting: Orthopaedic Surgery

## 2023-07-14 ENCOUNTER — Other Ambulatory Visit: Payer: Self-pay

## 2023-07-14 DIAGNOSIS — Z6841 Body Mass Index (BMI) 40.0 and over, adult: Secondary | ICD-10-CM | POA: Diagnosis not present

## 2023-07-14 DIAGNOSIS — G8929 Other chronic pain: Secondary | ICD-10-CM

## 2023-07-14 DIAGNOSIS — M545 Low back pain, unspecified: Secondary | ICD-10-CM

## 2023-07-14 DIAGNOSIS — E66813 Obesity, class 3: Secondary | ICD-10-CM

## 2023-07-14 DIAGNOSIS — M1611 Unilateral primary osteoarthritis, right hip: Secondary | ICD-10-CM

## 2023-07-14 DIAGNOSIS — M25551 Pain in right hip: Secondary | ICD-10-CM

## 2023-07-14 MED ORDER — TRAMADOL HCL 50 MG PO TABS: 50.0000 mg | ORAL_TABLET | Freq: Two times a day (BID) | ORAL | 1 refills | Status: DC | PRN

## 2023-07-14 MED ORDER — LIDOCAINE HCL 1 % IJ SOLN
4.0000 mL | INTRAMUSCULAR | Status: AC | PRN
  Administered 2023-07-14: 4 mL

## 2023-07-14 MED ORDER — METHYLPREDNISOLONE ACETATE 40 MG/ML IJ SUSP
80.0000 mg | INTRAMUSCULAR | Status: AC | PRN
  Administered 2023-07-14: 80 mg via INTRA_ARTICULAR

## 2023-07-14 NOTE — Progress Notes (Signed)
   Procedure Note  Patient: Sean Blake             Date of Birth: 1971/08/27           MRN: [id number]             Visit Date: 07/14/2023  Procedures: Visit Diagnoses:  1. Pain in right hip    Large Joint Inj: R hip joint on 07/14/2023 11:04 AM Indications: pain Details: 22 G 3.5 in needle, ultrasound-guided anterior approach Medications: 4 mL lidocaine 1 %; 80 mg methylPREDNISolone acetate 40 MG/ML Outcome: tolerated well, no immediate complications  Procedure: US-guided intra-articular hip injection, Right After discussion on risks/benefits/indications and informed verbal consent was obtained, a timeout was performed. Patient was lying supine on exam table. The hip was cleaned with betadine and alcohol swabs. Then utilizing ultrasound guidance, the patient's femoral head and neck junction was identified and subsequently injected with 4:2 lidocaine:depomedrol via an in-plane approach with ultrasound visualization of the injectate administered into the hip joint. Patient tolerated procedure well without immediate complications.  Procedure, treatment alternatives, risks and benefits explained, specific risks discussed. Consent was given by the patient. Immediately prior to procedure a time out was called to verify the correct patient, procedure, equipment, support staff and site/side marked as required. Patient was prepped and draped in the usual sterile fashion.     - follow-up with Sean Blake or Dr. Roda Blake as indicated  Madelyn Brunner, DO Primary Care Sports Medicine Physician  [facility] - Orthopedics  This note was dictated using Dragon naturally speaking software and may contain errors in syntax, spelling, or content which have not been identified prior to signing this note.

## 2023-07-14 NOTE — Progress Notes (Signed)
Office Visit Note   Patient: Sean Blake           Date of Birth: 03-06-1971           MRN: [id number] Visit Date: 07/14/2023              Requested by: No referring provider defined for this encounter. PCP: Pcp, No   Assessment & Plan: Visit Diagnoses:  1. Chronic low back pain, unspecified back pain laterality, unspecified whether sciatica present   2. Class 3 severe obesity with body mass index (BMI) of 45.0 to 49.9 in adult, unspecified obesity type, unspecified whether serious comorbidity present (HCC)   3. Primary osteoarthritis of right hip     Plan: Impression is chronic right-sided low back pain with right lower extremity radiculopathy as well as right hip osteoarthritis.  I believe his symptoms are coming from both his back and his hip.  Would like to refer him to Dr. Shon Baton for ultrasound-guided cortisone injections right hip joint.  If he still has symptoms to his lumbar spine, he will follow-up with Ellin Goodie for further evaluation of his back.  Follow-up with [place] as needed for his hip.  Total face to face encounter time was greater than 45 minutes and over half of this time was spent in counseling and/or coordination of care.   Follow-Up Instructions: No follow-ups on file.   Orders:  Orders Placed This Encounter  Procedures   XR Lumbar Spine 2-3 Views   Amb Ref to Medical Weight Management   Meds ordered this encounter  Medications   traMADol (ULTRAM) 50 MG tablet    Sig: Take 1 tablet (50 mg total) by mouth 2 (two) times daily as needed.    Dispense:  30 tablet    Refill:  1      Procedures: No procedures performed   Clinical Data: No additional findings.   Subjective: Chief Complaint  Patient presents with   Lower Back - Pain    HPI  Patient is a pleasant 52 year old gentleman who comes in today with right low back and hip pain.  Symptoms radiate down the entire leg and into his feet.  Pain is constant but worse with walking and  standing.  He notes an injury back in 2002 for which his symptoms started.  He has been taking ibuprofen without relief.  He does note paresthesias to the right lower extremity.  Review of Systems  Constitutional: Negative.   HENT: Negative.    Eyes: Negative.   Respiratory: Negative.    Cardiovascular: Negative.   Gastrointestinal: Negative.   Endocrine: Negative.   Genitourinary: Negative.   Skin: Negative.   Allergic/Immunologic: Negative.   Neurological: Negative.   Hematological: Negative.   Psychiatric/Behavioral: Negative.    All other systems reviewed and are negative.    Objective: Vital Signs: There were no vitals taken for this visit.  Physical Exam Vitals and nursing note reviewed.  Constitutional:      Appearance: He is well-developed.  HENT:     Head: Normocephalic and atraumatic.  Eyes:     Pupils: Pupils are equal, round, and reactive to light.  Pulmonary:     Effort: Pulmonary effort is normal.  Abdominal:     Palpations: Abdomen is soft.  Musculoskeletal:        General: Normal range of motion.     Cervical back: Neck supple.  Skin:    General: Skin is warm.  Neurological:     Mental Status:  He is alert and oriented to person, place, and time.  Psychiatric:        Behavior: Behavior normal.        Thought Content: Thought content normal.        Judgment: Judgment normal.     Ortho Exam Lumbar spine exam: Increased pain with lumbar flexion and extension.  Positive straight leg raise.  No focal weakness.  Right hip exam: Pain with logroll, FADIR and Stinchfield testing.  He is neurovascularly intact distally. Specialty Comments:  No specialty comments available.  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/12/2022   Chronic pain of left knee 07/12/2022   Chronic low back pain 07/12/2022   Chronic pain of both shoulders 07/12/2022   DDD (degenerative disc disease), lumbar 07/12/2022   Primary osteoarthritis of left knee 07/12/2022   Morbid obesity with BMI of 45.0-49.9, adult (HCC) 07/12/2022   Pre-diabetes 04/12/2022   Proteinuria 04/12/2022   Elevated lipids 04/12/2022   Elevated blood pressure reading 03/09/2022   Health care maintenance 09/22/2021   Mass of soft tissue of neck 09/15/2021   Arthritis 09/15/2021   Anxiety and depression 09/15/2021   Obesity 09/15/2021   Past Medical History:  Diagnosis Date   Anxiety    Arthritis     Family History  Problem Relation Age of Onset   Arthritis Mother    Other Father        unknown medical history   Arthritis Maternal Grandmother    Hypertension Maternal Grandmother    Heart attack Maternal Grandfather    Other Paternal Grandmother        unknown medical history   Arthritis Paternal Grandfather    Alzheimer's disease Paternal Grandfather     Past Surgical History:  Procedure Laterality Date   NO PAST SURGERIES     Social History   Occupational History   Not on file  Tobacco Use   Smoking status: Every Day    Current packs/day: 0.25    Average packs/day: 0.3 packs/day for 30.0 years (7.5 ttl pk-yrs)    Types: Cigarettes   Smokeless tobacco: Never  Vaping Use   Vaping status: Never Used  Substance and Sexual Activity   Alcohol use: Yes    Comment: 1-2 glasses of liquor 2 times per month   Drug use: Yes    Types: Marijuana    Comment: last use 03/2022   Sexual activity: Not on file

## 2023-11-06 ENCOUNTER — Ambulatory Visit (INDEPENDENT_AMBULATORY_CARE_PROVIDER_SITE_OTHER): Payer: Medicaid Other | Admitting: Nurse Practitioner

## 2023-11-06 ENCOUNTER — Encounter: Payer: Self-pay | Admitting: Nurse Practitioner

## 2023-11-06 VITALS — BP 142/84 | HR 86 | Temp 98.4°F | Ht 69.0 in | Wt 385.0 lb

## 2023-11-06 DIAGNOSIS — E66813 Obesity, class 3: Secondary | ICD-10-CM

## 2023-11-06 DIAGNOSIS — E7849 Other hyperlipidemia: Secondary | ICD-10-CM | POA: Diagnosis not present

## 2023-11-06 DIAGNOSIS — R030 Elevated blood-pressure reading, without diagnosis of hypertension: Secondary | ICD-10-CM | POA: Diagnosis not present

## 2023-11-06 DIAGNOSIS — R7303 Prediabetes: Secondary | ICD-10-CM | POA: Diagnosis not present

## 2023-11-06 DIAGNOSIS — Z6841 Body Mass Index (BMI) 40.0 and over, adult: Secondary | ICD-10-CM

## 2023-11-06 NOTE — Progress Notes (Signed)
 Office: [phone]  /  Fax: [phone]   Initial Visit  Sean Blake was seen in clinic today to evaluate for obesity. He is interested in losing weight to improve overall health and reduce the risk of weight related complications. He presents today to review program treatment options, initial physical assessment, and evaluation.     He was referred by: Dr. Roda Shutters  When asked what else they would like to accomplish? He states: Adopt healthier eating patterns, Improve quality of life, and Improve appearance  He started gaining weight after he got married to his second wife in 1999.  He gained excess weight during covid and losing his son in 2020 in a MVA.  His goal is to lose to help with knee and back pain.   When asked how has your weight affected you? He states: Contributed to orthopedic problems or mobility issues, Having fatigue, and Having poor endurance  Some associated conditions: arthritis,  DD, anxiety, depression, HTN, HLD, pre diabetes  Contributing factors: Family history of obesity, Use of obesogenic medications: Other: pain medications for back and knees, and Reduced physical activity  Weight promoting medications identified: Other: Percocet  Current nutrition plan: None  Current level of physical activity: None and Limited due to chronic pain or orthopedic problems-plans to start chair yoga  Current or previous pharmacotherapy: None  Response to medication: Never tried medications   Past medical history includes:   Past Medical History:  Diagnosis Date   Anxiety    Arthritis      Objective:   BP (!) 142/84   Pulse 86   Temp 98.4 F (36.9 C)   Ht 5\' 9"  (1.753 m)   Wt (!) 385 lb (174.6 kg) Comment: no strip available  SpO2 96%   BMI 56.85 kg/m  He was weighed on the bioimpedance scale: Body mass index is 56.85 kg/m.  Peak Weight:400 lbs , Body Fat%:unable to obtain, Visceral Fat Rating:unable to obtain, Weight trend over the last 12 months:  Increasing  General:  Alert, oriented and cooperative. Patient is in no acute distress.  Respiratory: Normal respiratory effort, no problems with respiration noted   Gait: able to ambulate independently  Mental Status: Normal mood and affect. Normal behavior. Normal judgment and thought content.   DIAGNOSTIC DATA REVIEWED:  BMET    Component Value Date/Time   NA 138 03/09/2022 0950   K 4.7 03/09/2022 0950   CL 103 03/09/2022 0950   CO2 21 03/09/2022 0950   GLUCOSE 95 03/09/2022 0950   GLUCOSE 102 (H) 05/19/2021 1445   BUN 23 03/09/2022 0950   CREATININE 0.87 03/09/2022 0950   CALCIUM 9.3 03/09/2022 0950   GFRNONAA 57 (L) 05/19/2021 1445   GFRAA >60 05/29/2018 1047   Lab Results  Component Value Date   HGBA1C 6.1 (H) 03/09/2022   No results found for: "INSULIN" CBC    Component Value Date/Time   WBC 8.8 03/09/2022 0950   WBC 11.7 (H) 05/19/2021 1445   RBC 5.22 03/09/2022 0950   RBC 4.86 05/19/2021 1445   HGB 16.3 03/09/2022 0950   HCT 48.0 03/09/2022 0950   PLT 318 03/09/2022 0950   MCV 92 03/09/2022 0950   MCH 31.2 03/09/2022 0950   MCH 32.1 05/19/2021 1445   MCHC 34.0 03/09/2022 0950   MCHC 35.1 05/19/2021 1445   RDW 12.8 03/09/2022 0950   Iron/TIBC/Ferritin/ %Sat No results found for: "IRON", "TIBC", "FERRITIN", "IRONPCTSAT" Lipid Panel     Component Value Date/Time   CHOL  218 (H) 03/09/2022 0950   TRIG 177 (H) 03/09/2022 0950   HDL 47 03/09/2022 0950   CHOLHDL 4.6 03/09/2022 0950   LDLCALC 139 (H) 03/09/2022 0950   Hepatic Function Panel     Component Value Date/Time   PROT 7.0 04/12/2022 1150   ALBUMIN 4.3 03/09/2022 0950   AST 24 03/09/2022 0950   ALT 37 03/09/2022 0950   ALKPHOS 67 03/09/2022 0950   BILITOT <0.2 03/09/2022 0950      Component Value Date/Time   TSH 1.230 09/15/2021 1040     Assessment and Plan:   Pre-diabetes Will obtain fasting labs at next visit  Other hyperlipidemia Will obtain fasting labs at next  visit  Elevated blood pressure reading Needs to follow up with PCP   Class 3 severe obesity due to excess calories with serious comorbidity and body mass index (BMI) of 50.0 to 59.9 in adult [facility])        Obesity Treatment / Action Plan:  Patient will work on garnering support from family and friends to begin weight loss journey. Will work on eliminating or reducing the presence of highly palatable, calorie dense foods in the home. Will complete provided nutritional and psychosocial assessment questionnaire before the next appointment. Will be scheduled for indirect calorimetry to determine resting energy expenditure in a fasting state.  This will allow [place] to create a reduced calorie, high-protein meal plan to promote loss of fat mass while preserving muscle mass. Counseled on the health benefits of losing 5%-15% of total body weight. Was counseled on nutritional approaches to weight loss and benefits of reducing processed foods and consuming plant-based foods and high quality protein as part of nutritional weight management. Was counseled on pharmacotherapy and role as an adjunct in weight management.   Obesity Education Performed Today:  He was weighed on the bioimpedance scale and results were discussed and documented in the synopsis.  We discussed obesity as a disease and the importance of a more detailed evaluation of all the factors contributing to the disease.  We discussed the importance of long term lifestyle changes which include nutrition, exercise and behavioral modifications as well as the importance of customizing this to his specific health and social needs.  We discussed the benefits of reaching a healthier weight to alleviate the symptoms of existing conditions and reduce the risks of the biomechanical, metabolic and psychological effects of obesity.  [name] appears to be in the action stage of change and states they are ready to start intensive lifestyle  modifications and behavioral modifications.  30 minutes was spent today on this visit including the above counseling, pre-visit chart review, and post-visit documentation.  Reviewed by clinician on day of visit: allergies, medications, problem list, medical history, surgical history, family history, social history, and previous encounter notes pertinent to obesity diagnosis.    Theodis Sato Fenna Semel FNP-C

## 2023-11-08 ENCOUNTER — Encounter: Admitting: Nurse Practitioner

## 2024-01-04 ENCOUNTER — Ambulatory Visit

## 2024-01-15 DIAGNOSIS — M1611 Unilateral primary osteoarthritis, right hip: Secondary | ICD-10-CM | POA: Diagnosis not present

## 2024-01-15 DIAGNOSIS — M51362 Other intervertebral disc degeneration, lumbar region with discogenic back pain and lower extremity pain: Secondary | ICD-10-CM | POA: Diagnosis not present

## 2024-02-05 DIAGNOSIS — M545 Low back pain, unspecified: Secondary | ICD-10-CM | POA: Diagnosis not present

## 2024-02-08 DIAGNOSIS — M545 Low back pain, unspecified: Secondary | ICD-10-CM | POA: Diagnosis not present

## 2024-03-15 DIAGNOSIS — A6923 Arthritis due to Lyme disease: Secondary | ICD-10-CM | POA: Diagnosis not present

## 2024-03-15 DIAGNOSIS — G959 Disease of spinal cord, unspecified: Secondary | ICD-10-CM | POA: Diagnosis not present

## 2024-03-15 DIAGNOSIS — S32009S Unspecified fracture of unspecified lumbar vertebra, sequela: Secondary | ICD-10-CM | POA: Diagnosis not present

## 2024-03-15 DIAGNOSIS — M1990 Unspecified osteoarthritis, unspecified site: Secondary | ICD-10-CM | POA: Diagnosis not present

## 2024-03-15 DIAGNOSIS — M48062 Spinal stenosis, lumbar region with neurogenic claudication: Secondary | ICD-10-CM | POA: Diagnosis not present

## 2024-04-03 DIAGNOSIS — M5416 Radiculopathy, lumbar region: Secondary | ICD-10-CM | POA: Diagnosis not present

## 2024-04-03 DIAGNOSIS — G959 Disease of spinal cord, unspecified: Secondary | ICD-10-CM | POA: Diagnosis not present

## 2024-04-08 DIAGNOSIS — M5416 Radiculopathy, lumbar region: Secondary | ICD-10-CM | POA: Diagnosis not present

## 2024-04-17 DIAGNOSIS — M5416 Radiculopathy, lumbar region: Secondary | ICD-10-CM | POA: Diagnosis not present

## 2024-06-19 ENCOUNTER — Ambulatory Visit
Admission: EM | Admit: 2024-06-19 | Discharge: 2024-06-19 | Disposition: A | Discharge status: Home / Self Care | Attending: Emergency Medicine | Admitting: Emergency Medicine

## 2024-06-19 ENCOUNTER — Other Ambulatory Visit: Payer: Self-pay

## 2024-06-19 DIAGNOSIS — R051 Acute cough: Secondary | ICD-10-CM

## 2024-06-19 DIAGNOSIS — J069 Acute upper respiratory infection, unspecified: Secondary | ICD-10-CM

## 2024-06-19 DIAGNOSIS — F17200 Nicotine dependence, unspecified, uncomplicated: Secondary | ICD-10-CM

## 2024-06-19 LAB — POC COVID19/FLU A&B COMBO
Covid Antigen, POC: NEGATIVE
Influenza A Antigen, POC: NEGATIVE
Influenza B Antigen, POC: NEGATIVE

## 2024-06-19 MED ORDER — ALBUTEROL SULFATE HFA 108 (90 BASE) MCG/ACT IN AERS
1.0000 | INHALATION_SPRAY | Freq: Four times a day (QID) | RESPIRATORY_TRACT | 0 refills | Status: AC | PRN
  Filled 2024-06-19: qty 18, 30d supply, fill #0

## 2024-06-19 MED ORDER — PREDNISONE 10 MG PO TABS
40.0000 mg | ORAL_TABLET | Freq: Every day | ORAL | 0 refills | Status: DC
  Filled 2024-06-19: qty 12, 3d supply, fill #0

## 2024-06-19 NOTE — Discharge Instructions (Addendum)
 Use the albuterol inhaler and take the prednisone as directed.  Go to the emergency department if you have persistent or worsening symptoms.  Follow-up with your primary care provider.

## 2024-06-19 NOTE — ED Triage Notes (Signed)
 Patient to Urgent Care with complaints of cough/ wheezing/ SHOB. Denies any fevers.   Symptoms x2 days. Worse today.   Meds: tylenol 

## 2024-06-19 NOTE — ED Provider Notes (Signed)
 Sean Blake    CSN: [id number] Arrival date & time: 06/19/24  1627      History   Chief Complaint Chief Complaint  Patient presents with   Cough    HPI Sean Blake is a 53 y.o. male.  Patient presents with 2-day history of cough, wheezing, shortness of breath.  No OTC medications taken today.  No fever or chest pain.  Current everyday smoker.  He denies history of lung disease.  The history is provided by the patient and medical records.    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   Chronic pain of left knee 07/12/2022   Chronic low back pain 07/12/2022   Chronic pain of both shoulders 07/12/2022   DDD (degenerative disc disease), lumbar 07/12/2022   Primary osteoarthritis of left knee 07/12/2022   Morbid obesity with BMI of 45.0-49.9, adult (HCC) 07/12/2022   Pre-diabetes 04/12/2022   Proteinuria 04/12/2022   Elevated lipids 04/12/2022   Elevated blood pressure reading 03/09/2022   Health care maintenance 09/22/2021   Mass of soft tissue of neck 09/15/2021   Arthritis 09/15/2021   Anxiety and depression 09/15/2021   Obesity 09/15/2021    Past Surgical History:  Procedure Laterality Date   NO PAST SURGERIES         Home Medications    Prior to Admission medications   Medication Sig Start Date End Date Taking? Authorizing Provider  albuterol (VENTOLIN HFA) 108 (90 Base) MCG/ACT inhaler Inhale 1-2 puffs into the lungs every 6 (six) hours as needed. 06/19/24  Yes Corlis Burnard DEL, NP  metFORMIN  (GLUCOPHAGE -XR) 500 MG 24 hr tablet Take 1 tablet (500 mg total) by mouth daily with breakfast. 10/26/22  Yes Karamalegos, Marsa PARAS, DO  predniSONE (DELTASONE) 10 MG tablet Take 4 tablets (40 mg total) by mouth daily for 3  days. 06/19/24 06/22/24 Yes Corlis Burnard DEL, NP  ibuprofen  (ADVIL ) 200 MG tablet Take 200 mg by mouth every 6 (six) hours as needed.    [provider]  tiZANidine  (ZANAFLEX ) 4 MG tablet Take 1 tablet (4 mg total) by mouth every 8 (eight) hours as needed for muscle spasms. 07/12/22   Karamalegos, Marsa PARAS, DO  traMADol  (ULTRAM ) 50 MG tablet Take 1 tablet (50 mg total) by mouth 2 (two) times daily as needed. 07/14/23   Jule Ronal CROME, PA-C    Family History Family History  Problem Relation Age of Onset   Arthritis Mother    Other Father        unknown medical history   Arthritis Maternal Grandmother    Hypertension Maternal Grandmother    Heart attack Maternal Grandfather    Other Paternal Grandmother        unknown medical history   Arthritis Paternal Grandfather    Alzheimer's disease Paternal Grandfather     Social History Social History   Tobacco Use   Smoking status: Every Day    Current packs/day: 0.25    Average packs/day: 0.3 packs/day for 30.0 years (7.5 ttl pk-yrs)    Types: Cigarettes   Smokeless tobacco: Never  Vaping Use   Vaping status: Never Used  Substance Use Topics   Alcohol use: Yes    Comment: 1-2 glasses of liquor 2 times  per month   Drug use: Yes    Types: Marijuana    Comment: last use 03/2022     Allergies   Gabapentin   Review of Systems Review of Systems  Constitutional:  Negative for chills and fever.  HENT:  Positive for congestion. Negative for ear pain and sore throat.   Respiratory:  Positive for cough, shortness of breath and wheezing.   Cardiovascular:  Negative for chest pain and palpitations.     Physical Exam Triage Vital Signs ED Triage Vitals  Encounter Vitals Group     BP 06/19/24 1754 (!) 149/90     Girls Systolic BP Percentile --      Girls Diastolic BP Percentile --      Boys Systolic BP Percentile --      Boys Diastolic BP Percentile --      Pulse Rate 06/19/24 1754 93     Resp 06/19/24 1754 18      Temp 06/19/24 1754 98.3 F (36.8 C)     Temp src --      SpO2 06/19/24 1754 96 %     Weight --      Height --      Head Circumference --      Peak Flow --      Pain Score 06/19/24 1747 5     Pain Loc --      Pain Education --      Exclude from Growth Chart --    No data found.  Updated Vital Signs BP (!) 149/90   Pulse 93   Temp 98.3 F (36.8 C)   Resp 18   SpO2 96%   Visual Acuity Right Eye Distance:   Left Eye Distance:   Bilateral Distance:    Right Eye Near:   Left Eye Near:    Bilateral Near:     Physical Exam Constitutional:      General: He is not in acute distress.    Appearance: He is obese.  HENT:     Right Ear: Tympanic membrane normal.     Left Ear: Tympanic membrane normal.     Nose: Nose normal.     Mouth/Throat:     Mouth: Mucous membranes are moist.     Pharynx: Oropharynx is clear.  Cardiovascular:     Rate and Rhythm: Normal rate and regular rhythm.     Heart sounds: Normal heart sounds.  Pulmonary:     Effort: Pulmonary effort is normal. No respiratory distress.     Breath sounds: Normal breath sounds.     Comments: Frequent cough Neurological:     Mental Status: He is alert.      UC Treatments / Results  Labs (all labs ordered are listed, but only abnormal results are displayed) Labs Reviewed  POC COVID19/FLU A&B COMBO    EKG   Radiology No results found.  Procedures Procedures (including critical care time)  Medications Ordered in UC Medications - No data to display  Initial Impression / Assessment and Plan / UC Course  I have reviewed the triage vital signs and the nursing notes.  Pertinent labs & imaging results that were available during my care of the patient were reviewed by me and considered in my medical decision making (see chart for details).   Viral URI, cough, current everyday smoker.  Afebrile and vital signs are stable.  O2 sat 96% on room air.  Patient declines chest x-ray today.  Rapid COVID and flu  are negative.  Treating today with albuterol inhaler and 3-day course of prednisone.  ED precautions discussed.  Education provided on viral respiratory infection.  Instructed patient to follow-up with his PCP.  He agrees to plan of care.  Final Clinical Impressions(s) / UC Diagnoses   Final diagnoses:  Viral URI  Acute cough  Current every day smoker     Discharge Instructions      Use the albuterol inhaler and take the prednisone as directed.  Go to the emergency department if you have persistent or worsening symptoms.  Follow-up with your primary care provider.     ED Prescriptions     Medication Sig Dispense Auth. Provider   albuterol (VENTOLIN HFA) 108 (90 Base) MCG/ACT inhaler Inhale 1-2 puffs into the lungs every 6 (six) hours as needed. 18 g Corlis Burnard DEL, NP   predniSONE (DELTASONE) 10 MG tablet Take 4 tablets (40 mg total) by mouth daily for 3 days. 12 tablet Corlis Burnard DEL, NP      PDMP not reviewed this encounter.   Corlis Burnard DEL, NP 06/19/24 [phone]

## 2024-06-21 ENCOUNTER — Encounter: Payer: Self-pay | Admitting: Emergency Medicine

## 2024-06-21 ENCOUNTER — Ambulatory Visit
Admission: EM | Admit: 2024-06-21 | Discharge: 2024-06-21 | Disposition: A | Discharge status: Home / Self Care | Attending: Emergency Medicine | Admitting: Emergency Medicine

## 2024-06-21 DIAGNOSIS — B9689 Other specified bacterial agents as the cause of diseases classified elsewhere: Secondary | ICD-10-CM

## 2024-06-21 DIAGNOSIS — H109 Unspecified conjunctivitis: Secondary | ICD-10-CM

## 2024-06-21 DIAGNOSIS — R051 Acute cough: Secondary | ICD-10-CM | POA: Diagnosis not present

## 2024-06-21 MED ORDER — BENZONATATE 100 MG PO CAPS
100.0000 mg | ORAL_CAPSULE | Freq: Three times a day (TID) | ORAL | 0 refills | Status: AC
Start: 2024-06-21 — End: ?

## 2024-06-21 MED ORDER — MOXIFLOXACIN HCL 0.5 % OP SOLN
1.0000 [drp] | Freq: Three times a day (TID) | OPHTHALMIC | 0 refills | Status: AC
Start: 2024-06-21 — End: ?

## 2024-06-21 MED ORDER — PREDNISONE 10 MG PO TABS: 40.0000 mg | ORAL_TABLET | Freq: Every day | ORAL | 0 refills | Status: AC

## 2024-06-21 MED ORDER — PROMETHAZINE-DM 6.25-15 MG/5ML PO SYRP
5.0000 mL | ORAL_SOLUTION | Freq: Every evening | ORAL | 0 refills | Status: AC | PRN
Start: 2024-06-21 — End: ?

## 2024-06-21 NOTE — ED Provider Notes (Signed)
 [facility]    CSN: [id number] Arrival date & time: 06/21/24  1659      History   Chief Complaint Chief Complaint  Patient presents with   Conjunctivitis    HPI Sean Blake is a 53 y.o. male.   Patient presents for evaluation of bilateral erythema, drainage, mildly blurred vision and pruritus present to the eyes beginning 2 days ago.  Has accompanying upper respiratory infection, seen in this urgent care 2 days ago, endorses that he lost steroid medicine.  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   Chronic pain of left knee 07/12/2022   Chronic low back pain 07/12/2022   Chronic pain of both shoulders 07/12/2022   DDD (degenerative disc disease), lumbar 07/12/2022   Primary osteoarthritis of left knee 07/12/2022   Morbid obesity with BMI of 45.0-49.9, adult (HCC) 07/12/2022   Pre-diabetes 04/12/2022   Proteinuria 04/12/2022   Elevated lipids 04/12/2022   Elevated blood pressure reading 03/09/2022   Health care maintenance 09/22/2021   Mass of soft tissue of neck 09/15/2021   Arthritis 09/15/2021   Anxiety and depression 09/15/2021   Obesity 09/15/2021    Past Surgical History:  Procedure Laterality Date   NO PAST SURGERIES         Home Medications    Prior to Admission medications   Medication Sig Start Date End Date Taking? Authorizing Provider  benzonatate (TESSALON) 100 MG capsule Take 1 capsule (100 mg total) by mouth every 8 (eight) hours. 06/21/24  Yes Abayomi Pattison R, NP  moxifloxacin (VIGAMOX) 0.5 % ophthalmic solution Place 1 drop into both eyes 3 (three) times daily. 06/21/24  Yes Alayziah Tangeman R, NP  promethazine-dextromethorphan (PROMETHAZINE-DM) 6.25-15 MG/5ML syrup Take 5 mLs by mouth at  bedtime as needed. 06/21/24  Yes Shealyn Sean R, NP  albuterol (VENTOLIN HFA) 108 (90 Base) MCG/ACT inhaler Inhale 1-2 puffs into the lungs every 6 (six) hours as needed. 06/19/24   Corlis Burnard DEL, NP  ibuprofen  (ADVIL ) 200 MG tablet Take 200 mg by mouth every 6 (six) hours as needed.    [provider]  metFORMIN  (GLUCOPHAGE -XR) 500 MG 24 hr tablet Take 1 tablet (500 mg total) by mouth daily with breakfast. 10/26/22   Edman, Marsa PARAS, DO  predniSONE (DELTASONE) 10 MG tablet Take 4 tablets (40 mg total) by mouth daily for 3 days. 06/21/24 06/24/24  Teresa Shelba SAUNDERS, NP  tiZANidine  (ZANAFLEX ) 4 MG tablet Take 1 tablet (4 mg total) by mouth every 8 (eight) hours as needed for muscle spasms. 07/12/22   Karamalegos, Marsa PARAS, DO  traMADol  (ULTRAM ) 50 MG tablet Take 1 tablet (50 mg total) by mouth 2 (two) times daily as needed. 07/14/23   Jule Ronal CROME, PA-C    Family History Family History  Problem Relation Age of Onset   Arthritis Mother    Other Father        unknown medical history   Arthritis Maternal Grandmother    Hypertension Maternal Grandmother    Heart attack Maternal Grandfather    Other Paternal Grandmother        unknown medical history   Arthritis Paternal Grandfather    Alzheimer's disease Paternal Grandfather     Social History Social History  Tobacco Use   Smoking status: Every Day    Current packs/day: 0.25    Average packs/day: 0.3 packs/day for 30.0 years (7.5 ttl pk-yrs)    Types: Cigarettes   Smokeless tobacco: Never  Vaping Use   Vaping status: Never Used  Substance Use Topics   Alcohol use: Yes    Comment: 1-2 glasses of liquor 2 times per month   Drug use: Yes    Types: Marijuana    Comment: last use 03/2022     Allergies   Gabapentin   Review of Systems Review of Systems   Physical Exam Triage Vital Signs ED Triage Vitals  Encounter Vitals Group     BP 06/21/24 1804 (!) 146/89     Girls Systolic BP Percentile --       Girls Diastolic BP Percentile --      Boys Systolic BP Percentile --      Boys Diastolic BP Percentile --      Pulse Rate 06/21/24 1804 87     Resp 06/21/24 1804 18     Temp 06/21/24 1804 98 F (36.7 C)     Temp Source 06/21/24 1804 Oral     SpO2 06/21/24 1804 96 %     Weight --      Height --      Head Circumference --      Peak Flow --      Pain Score 06/21/24 1802 0     Pain Loc --      Pain Education --      Exclude from Growth Chart --    No data found.  Updated Vital Signs BP (!) 146/89 (BP Location: Left Arm)   Pulse 87   Temp 98 F (36.7 C) (Oral)   Resp 18   SpO2 96%   Visual Acuity Right Eye Distance:   Left Eye Distance:   Bilateral Distance:    Right Eye Near:   Left Eye Near:    Bilateral Near:     Physical Exam Constitutional:      Appearance: Normal appearance.  Eyes:     Extraocular Movements: Extraocular movements intact.     Comments: Bilateral eye erythema, Alexandra Lipps drainage to the left inner corner of the eye, vision grossly intact  Pulmonary:     Effort: Pulmonary effort is normal.     Breath sounds: Normal breath sounds.  Neurological:     Mental Status: He is alert and oriented to person, place, and time.      UC Treatments / Results  Labs (all labs ordered are listed, but only abnormal results are displayed) Labs Reviewed - No data to display  EKG   Radiology No results found.  Procedures Procedures (including critical care time)  Medications Ordered in UC Medications - No data to display  Initial Impression / Assessment and Plan / UC Course  I have reviewed the triage vital signs and the nursing notes.  Pertinent labs & imaging results that were available during my care of the patient were reviewed by me and considered in my medical decision making (see chart for details).  Bacterial conjunctivitis of both eyes, acute cough  Presentation of the eye consistent with a conjunctivitis, discussed with patient,  prescribed moxifloxacin and discussed administration, recommended supportive care, for management of cough which is a viral load prescribed Tessalon, Promethazine DM and recent prednisone, recommended over-the-counter medications and nonpharmacological supportive care with follow-up as needed Final Clinical Impressions(s) / UC Diagnoses   Final  diagnoses:  Bacterial conjunctivitis of both eyes  Acute cough     Discharge Instructions      Today you being treated for bacterial conjunctivitis.   Prednisone resent to the pharmacy additionally prescribed Tessalon which may be used every 8 hours for cough and cough syrup for bedtime  Place one drop of moxifloxacin into the effected eye every 8 hours while awake for 7 days. If the other eye starts to have symptoms you may use medication in it as well. Do not allow tip of dropper to touch eye.  May use cool compress for comfort and to remove discharge if present. Pat the eye, do not wipe.  If wearing contacts, dispose of current pair. Wear glasses until symptoms have resolved.   Do not rub eyes, this may cause more irritation.  If symptoms persist after use of medication, please follow up at Urgent Care or with ophthalmologist (eye doctor)    ED Prescriptions     Medication Sig Dispense Auth. Provider   moxifloxacin (VIGAMOX) 0.5 % ophthalmic solution Place 1 drop into both eyes 3 (three) times daily. 3 mL Mallori Araque R, NP   benzonatate (TESSALON) 100 MG capsule Take 1 capsule (100 mg total) by mouth every 8 (eight) hours. 21 capsule Minyon Billiter R, NP   promethazine-dextromethorphan (PROMETHAZINE-DM) 6.25-15 MG/5ML syrup Take 5 mLs by mouth at bedtime as needed. 118 mL Camey Edell R, NP   predniSONE (DELTASONE) 10 MG tablet Take 4 tablets (40 mg total) by mouth daily for 3 days. 12 tablet Dathan Attia R, NP      PDMP not reviewed this encounter.   Teresa Shelba SAUNDERS, NP 06/21/24 [phone]

## 2024-06-21 NOTE — ED Triage Notes (Signed)
 Patient reports itchy, red eyes. Patient also complains of drainage x 2 days. Patient has not taken anything for symptoms.

## 2024-06-21 NOTE — Discharge Instructions (Signed)
 Today you being treated for bacterial conjunctivitis.   Prednisone resent to the pharmacy additionally prescribed Tessalon which may be used every 8 hours for cough and cough syrup for bedtime  Place one drop of moxifloxacin into the effected eye every 8 hours while awake for 7 days. If the other eye starts to have symptoms you may use medication in it as well. Do not allow tip of dropper to touch eye.  May use cool compress for comfort and to remove discharge if present. Pat the eye, do not wipe.  If wearing contacts, dispose of current pair. Wear glasses until symptoms have resolved.   Do not rub eyes, this may cause more irritation.  If symptoms persist after use of medication, please follow up at Urgent Care or with ophthalmologist (eye doctor)

## 2024-06-28 ENCOUNTER — Telehealth: Admitting: Physician Assistant

## 2024-06-28 DIAGNOSIS — J069 Acute upper respiratory infection, unspecified: Secondary | ICD-10-CM

## 2024-06-28 NOTE — Progress Notes (Signed)
 Virtual Visit Consent   Sean Blake, you are scheduled for a virtual visit with a [facility] provider today. Just as with appointments in the office, your consent must be obtained to participate. Your consent will be active for this visit and any virtual visit you may have with one of our providers in the next 365 days. If you have a MyChart account, a copy of this consent can be sent to you electronically.  As this is a virtual visit, video technology does not allow for your provider to perform a traditional examination. This may limit your provider's ability to fully assess your condition. If your provider identifies any concerns that need to be evaluated in person or the need to arrange testing (such as labs, EKG, etc.), we will make arrangements to do so. Although advances in technology are sophisticated, we cannot ensure that it will always work on either your end or our end. If the connection with a video visit is poor, the visit may have to be switched to a telephone visit. With either a video or telephone visit, we are not always able to ensure that we have a secure connection.  By engaging in this virtual visit, you consent to the provision of healthcare and authorize for your insurance to be billed (if applicable) for the services provided during this visit. Depending on your insurance coverage, you may receive a charge related to this service.  I need to obtain your verbal consent now. Are you willing to proceed with your visit today? Sean Blake has provided verbal consent on 06/28/2024 for a virtual visit (video or telephone). Teena Shuck, [place]  Date: 06/28/2024 11:50 AM   Virtual Visit via Video Note   I, Teena Shuck, connected with  Sean Blake  ([id number], 1971-01-25) on 06/28/24 at 11:45 AM EDT by a video-enabled telemedicine application and verified that I am speaking with the correct person using two identifiers.  Location: Patient: Virtual Visit Location  Patient: Home Provider: Virtual Visit Location Provider: Home Office   I discussed the limitations of evaluation and management by telemedicine and the availability of in person appointments. The patient expressed understanding and agreed to proceed.    History of Present Illness: Sean Blake is a 53 y.o. who identifies as a male who was assigned male at birth, and is being seen today for ongoing cough after being treated at urgent care on 06/19/2024.  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   Chronic pain of left knee 07/12/2022   Chronic low back pain 07/12/2022   Chronic pain of both shoulders 07/12/2022   DDD (degenerative disc disease), lumbar 07/12/2022   Primary osteoarthritis of left knee 07/12/2022   Morbid obesity with BMI of 45.0-49.9, adult (HCC) 07/12/2022   Pre-diabetes 04/12/2022   Proteinuria 04/12/2022   Elevated lipids 04/12/2022   Elevated blood pressure reading 03/09/2022   Health care maintenance 09/22/2021   Mass of soft tissue of neck 09/15/2021   Arthritis 09/15/2021   Anxiety and depression 09/15/2021   Obesity 09/15/2021    Allergies:  Allergies  Allergen Reactions   Gabapentin Palpitations and Other (See Comments)    convulsions   Medications:  Current Outpatient Medications:    albuterol (VENTOLIN HFA) 108 (90 Base) MCG/ACT inhaler, Inhale 1-2 puffs into the lungs every  6 (six) hours as needed., Disp: 18 g, Rfl: 0   benzonatate (TESSALON) 100 MG capsule, Take 1 capsule (100 mg total) by mouth every 8 (eight) hours., Disp: 21 capsule, Rfl: 0   ibuprofen  (ADVIL ) 200 MG tablet, Take 200 mg by mouth every 6 (six) hours as needed., Disp: , Rfl:    metFORMIN  (GLUCOPHAGE -XR) 500 MG 24 hr tablet, Take 1 tablet (500 mg total) by mouth daily with breakfast., Disp: 90  tablet, Rfl: 3   moxifloxacin (VIGAMOX) 0.5 % ophthalmic solution, Place 1 drop into both eyes 3 (three) times daily., Disp: 3 mL, Rfl: 0   promethazine-dextromethorphan (PROMETHAZINE-DM) 6.25-15 MG/5ML syrup, Take 5 mLs by mouth at bedtime as needed., Disp: 118 mL, Rfl: 0   tiZANidine  (ZANAFLEX ) 4 MG tablet, Take 1 tablet (4 mg total) by mouth every 8 (eight) hours as needed for muscle spasms., Disp: 90 tablet, Rfl: 2   traMADol  (ULTRAM ) 50 MG tablet, Take 1 tablet (50 mg total) by mouth 2 (two) times daily as needed., Disp: 30 tablet, Rfl: 1  Observations/Objective: Patient is well-developed, well-nourished in no acute distress.  Resting comfortably  at home.  Head is normocephalic, atraumatic.  No labored breathing.  Speech is clear and coherent with logical content.  Patient is alert and oriented at baseline.    Assessment and Plan: 1. Upper respiratory tract infection, unspecified type (Primary)  Patient with worsening cough, mucus production s/p steroids and inhaler treatment. Recommend return to in person visit for additional evaluation and workup. HE agreed to this plan.   Follow Up Instructions: I discussed the assessment and treatment plan with the patient. The patient was provided an opportunity to ask questions and all were answered. The patient agreed with the plan and demonstrated an understanding of the instructions.  A copy of instructions were sent to the patient via MyChart unless otherwise noted below.    The patient was advised to call back or seek an in-person evaluation if the symptoms worsen or if the condition fails to improve as anticipated.    Teena Shuck, PA-C

## 2024-06-28 NOTE — Patient Instructions (Signed)
  Sean Blake, thank you for joining Teena Shuck, PA-C for today's virtual visit.  While this provider is not your primary care provider (PCP), if your PCP is located in our provider database this encounter information will be shared with them immediately following your visit.   A [facility] MyChart account gives you access to today's visit and all your visits, tests, and labs performed at [facility]  click here if you don't have a [facility] MyChart account or go to [email or web address]  Consent: (Patient) Sean Blake provided verbal consent for this virtual visit at the beginning of the encounter.  Current Medications:  Current Outpatient Medications:    albuterol (VENTOLIN HFA) 108 (90 Base) MCG/ACT inhaler, Inhale 1-2 puffs into the lungs every 6 (six) hours as needed., Disp: 18 g, Rfl: 0   benzonatate (TESSALON) 100 MG capsule, Take 1 capsule (100 mg total) by mouth every 8 (eight) hours., Disp: 21 capsule, Rfl: 0   ibuprofen  (ADVIL ) 200 MG tablet, Take 200 mg by mouth every 6 (six) hours as needed., Disp: , Rfl:    metFORMIN  (GLUCOPHAGE -XR) 500 MG 24 hr tablet, Take 1 tablet (500 mg total) by mouth daily with breakfast., Disp: 90 tablet, Rfl: 3   moxifloxacin (VIGAMOX) 0.5 % ophthalmic solution, Place 1 drop into both eyes 3 (three) times daily., Disp: 3 mL, Rfl: 0   promethazine-dextromethorphan (PROMETHAZINE-DM) 6.25-15 MG/5ML syrup, Take 5 mLs by mouth at bedtime as needed., Disp: 118 mL, Rfl: 0   tiZANidine  (ZANAFLEX ) 4 MG tablet, Take 1 tablet (4 mg total) by mouth every 8 (eight) hours as needed for muscle spasms., Disp: 90 tablet, Rfl: 2   traMADol  (ULTRAM ) 50 MG tablet, Take 1 tablet (50 mg total) by mouth 2 (two) times daily as needed., Disp: 30 tablet, Rfl: 1   Medications ordered in this encounter:  No orders of the defined types were placed in this encounter.    *If you need refills on other medications prior to your next appointment, please  contact your pharmacy*  Follow-Up: Call back or seek an in-person evaluation if the symptoms worsen or if the condition fails to improve as anticipated.  [facility] [phone]  Other Instructions Return to urgent care for in person evaluation.    If you have been instructed to have an in-person evaluation today at a local Urgent Care facility, please use the link below. It will take you to a list of all of our available [facility], including address, phone number and hours of operation. Please do not delay care.  [facility]  If you or a family member do not have a primary care provider, use the link below to schedule a visit and establish care. When you choose a [facility] primary care physician or advanced practice provider, you gain a long-term partner in health. Find a Primary Care Provider  Learn more about [facility]'s in-office and virtual care options: [facility] - Get Care Now

## 2024-07-01 ENCOUNTER — Encounter: Payer: Self-pay | Admitting: Radiology

## 2024-07-13 DIAGNOSIS — J9621 Acute and chronic respiratory failure with hypoxia: Secondary | ICD-10-CM | POA: Diagnosis not present

## 2024-07-13 DIAGNOSIS — J9690 Respiratory failure, unspecified, unspecified whether with hypoxia or hypercapnia: Secondary | ICD-10-CM | POA: Diagnosis not present

## 2024-07-13 DIAGNOSIS — E875 Hyperkalemia: Secondary | ICD-10-CM | POA: Diagnosis not present

## 2024-07-13 DIAGNOSIS — J9602 Acute respiratory failure with hypercapnia: Secondary | ICD-10-CM | POA: Diagnosis not present

## 2024-07-13 DIAGNOSIS — Z720 Tobacco use: Secondary | ICD-10-CM | POA: Diagnosis not present

## 2024-07-13 DIAGNOSIS — F17210 Nicotine dependence, cigarettes, uncomplicated: Secondary | ICD-10-CM | POA: Diagnosis not present

## 2024-07-13 DIAGNOSIS — E662 Morbid (severe) obesity with alveolar hypoventilation: Secondary | ICD-10-CM | POA: Diagnosis not present

## 2024-07-13 DIAGNOSIS — J9622 Acute and chronic respiratory failure with hypercapnia: Secondary | ICD-10-CM | POA: Diagnosis not present

## 2024-07-13 DIAGNOSIS — Z6841 Body Mass Index (BMI) 40.0 and over, adult: Secondary | ICD-10-CM | POA: Diagnosis not present

## 2024-07-13 DIAGNOSIS — M545 Low back pain, unspecified: Secondary | ICD-10-CM | POA: Diagnosis not present

## 2024-07-13 DIAGNOSIS — J9801 Acute bronchospasm: Secondary | ICD-10-CM | POA: Diagnosis not present

## 2024-07-13 DIAGNOSIS — G8929 Other chronic pain: Secondary | ICD-10-CM | POA: Diagnosis not present

## 2024-07-13 DIAGNOSIS — R918 Other nonspecific abnormal finding of lung field: Secondary | ICD-10-CM | POA: Diagnosis not present

## 2024-07-13 DIAGNOSIS — R600 Localized edema: Secondary | ICD-10-CM | POA: Diagnosis not present

## 2024-07-13 DIAGNOSIS — E119 Type 2 diabetes mellitus without complications: Secondary | ICD-10-CM | POA: Diagnosis not present

## 2024-07-13 DIAGNOSIS — J209 Acute bronchitis, unspecified: Secondary | ICD-10-CM | POA: Diagnosis not present

## 2024-07-13 DIAGNOSIS — E041 Nontoxic single thyroid nodule: Secondary | ICD-10-CM | POA: Diagnosis not present

## 2024-07-13 DIAGNOSIS — R0989 Other specified symptoms and signs involving the circulatory and respiratory systems: Secondary | ICD-10-CM | POA: Diagnosis not present

## 2024-07-13 DIAGNOSIS — L539 Erythematous condition, unspecified: Secondary | ICD-10-CM | POA: Diagnosis not present

## 2024-07-13 DIAGNOSIS — R0902 Hypoxemia: Secondary | ICD-10-CM | POA: Diagnosis not present

## 2024-07-13 DIAGNOSIS — J9601 Acute respiratory failure with hypoxia: Secondary | ICD-10-CM | POA: Diagnosis not present

## 2024-07-13 DIAGNOSIS — J9811 Atelectasis: Secondary | ICD-10-CM | POA: Diagnosis not present

## 2024-07-14 LAB — HEPATIC FUNCTION PANEL
ALT: 69 U/L — AB (ref 10–40)
AST: 43 — AB (ref 14–40)
Alkaline Phosphatase: 76 (ref 25–125)
Bilirubin, Total: 0.2

## 2024-07-14 LAB — TSH: TSH: 0.34 — AB (ref 0.41–5.90)

## 2024-07-14 LAB — HEMOGLOBIN A1C: Hemoglobin A1C: 6.5

## 2024-07-15 DIAGNOSIS — I890 Lymphedema, not elsewhere classified: Secondary | ICD-10-CM | POA: Insufficient documentation

## 2024-07-15 DIAGNOSIS — E119 Type 2 diabetes mellitus without complications: Secondary | ICD-10-CM | POA: Insufficient documentation

## 2024-07-15 DIAGNOSIS — E041 Nontoxic single thyroid nodule: Secondary | ICD-10-CM | POA: Insufficient documentation

## 2024-07-16 DIAGNOSIS — E662 Morbid (severe) obesity with alveolar hypoventilation: Secondary | ICD-10-CM | POA: Insufficient documentation

## 2024-07-17 LAB — BASIC METABOLIC PANEL WITH GFR
BUN: 25 — AB (ref 4–21)
CO2: 35 — AB (ref 13–22)
Chloride: 97 — AB (ref 99–108)
Creatinine: 0.8 (ref 0.6–1.3)
Glucose: 120
Potassium: 4.7 meq/L (ref 3.5–5.1)
Sodium: 139 (ref 137–147)

## 2024-07-17 LAB — CBC AND DIFFERENTIAL
HCT: 45 (ref 41–53)
Hemoglobin: 15 (ref 13.5–17.5)
Platelets: 292 K/uL (ref 150–400)
WBC: 10.5

## 2024-07-17 LAB — COMPREHENSIVE METABOLIC PANEL WITH GFR: eGFR: 104.5

## 2024-07-17 LAB — CBC: RBC: 4.91 (ref 3.87–5.11)

## 2024-08-08 ENCOUNTER — Ambulatory Visit: Admitting: Internal Medicine

## 2024-08-08 ENCOUNTER — Encounter: Payer: Self-pay | Admitting: Internal Medicine

## 2024-08-08 VITALS — BP 150/84 | HR 100 | Ht 69.0 in | Wt >= 6400 oz

## 2024-08-08 DIAGNOSIS — E782 Mixed hyperlipidemia: Secondary | ICD-10-CM

## 2024-08-08 DIAGNOSIS — M5441 Lumbago with sciatica, right side: Secondary | ICD-10-CM | POA: Insufficient documentation

## 2024-08-08 DIAGNOSIS — E1165 Type 2 diabetes mellitus with hyperglycemia: Secondary | ICD-10-CM

## 2024-08-08 DIAGNOSIS — Z125 Encounter for screening for malignant neoplasm of prostate: Secondary | ICD-10-CM | POA: Insufficient documentation

## 2024-08-08 DIAGNOSIS — E538 Deficiency of other specified B group vitamins: Secondary | ICD-10-CM | POA: Insufficient documentation

## 2024-08-08 DIAGNOSIS — E1159 Type 2 diabetes mellitus with other circulatory complications: Secondary | ICD-10-CM

## 2024-08-08 DIAGNOSIS — F1010 Alcohol abuse, uncomplicated: Secondary | ICD-10-CM | POA: Insufficient documentation

## 2024-08-08 DIAGNOSIS — G4734 Idiopathic sleep related nonobstructive alveolar hypoventilation: Secondary | ICD-10-CM | POA: Insufficient documentation

## 2024-08-08 DIAGNOSIS — F332 Major depressive disorder, recurrent severe without psychotic features: Secondary | ICD-10-CM | POA: Insufficient documentation

## 2024-08-08 DIAGNOSIS — E1169 Type 2 diabetes mellitus with other specified complication: Secondary | ICD-10-CM | POA: Insufficient documentation

## 2024-08-08 DIAGNOSIS — M7989 Other specified soft tissue disorders: Secondary | ICD-10-CM

## 2024-08-08 DIAGNOSIS — E119 Type 2 diabetes mellitus without complications: Secondary | ICD-10-CM | POA: Insufficient documentation

## 2024-08-08 DIAGNOSIS — K921 Melena: Secondary | ICD-10-CM | POA: Insufficient documentation

## 2024-08-08 DIAGNOSIS — E559 Vitamin D deficiency, unspecified: Secondary | ICD-10-CM | POA: Insufficient documentation

## 2024-08-08 DIAGNOSIS — Z6841 Body Mass Index (BMI) 40.0 and over, adult: Secondary | ICD-10-CM

## 2024-08-08 DIAGNOSIS — I152 Hypertension secondary to endocrine disorders: Secondary | ICD-10-CM | POA: Insufficient documentation

## 2024-08-08 DIAGNOSIS — M51361 Other intervertebral disc degeneration, lumbar region with lower extremity pain only: Secondary | ICD-10-CM

## 2024-08-08 DIAGNOSIS — Z7409 Other reduced mobility: Secondary | ICD-10-CM | POA: Insufficient documentation

## 2024-08-08 DIAGNOSIS — E041 Nontoxic single thyroid nodule: Secondary | ICD-10-CM | POA: Insufficient documentation

## 2024-08-08 DIAGNOSIS — I890 Lymphedema, not elsewhere classified: Secondary | ICD-10-CM | POA: Insufficient documentation

## 2024-08-08 MED ORDER — BENAZEPRIL HCL 20 MG PO TABS: 10.0000 mg | ORAL_TABLET | Freq: Every day | ORAL | 11 refills | Status: DC

## 2024-08-08 MED ORDER — BLOOD GLUCOSE MONITORING SUPPL W/DEVICE KIT: 1.0000 | PACK | Freq: Every day | 0 refills | Status: AC

## 2024-08-08 MED ORDER — FLUOXETINE HCL 20 MG PO CAPS: 20.0000 mg | ORAL_CAPSULE | Freq: Every day | ORAL | 2 refills | Status: DC

## 2024-08-08 MED ORDER — LISINOPRIL-HYDROCHLOROTHIAZIDE 20-25 MG PO TABS: 1.0000 | ORAL_TABLET | Freq: Every day | ORAL | 6 refills | Status: DC

## 2024-08-08 MED ORDER — EMPAGLIFLOZIN 10 MG PO TABS: 10.0000 mg | ORAL_TABLET | Freq: Every day | ORAL | 6 refills | Status: DC
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
## 2024-08-16 ENCOUNTER — Other Ambulatory Visit

## 2024-08-19 ENCOUNTER — Ambulatory Visit: Admitting: Internal Medicine

## 2024-08-19 LAB — FOLATE: Folate: 15.3 ng/mL

## 2024-08-19 LAB — LIPID PANEL W/O CHOL/HDL RATIO
Cholesterol, Total: 210 mg/dL — ABNORMAL HIGH (ref 100–199)
HDL: 49 mg/dL
LDL Chol Calc (NIH): 141 mg/dL — ABNORMAL HIGH (ref 0–99)
Triglycerides: 110 mg/dL (ref 0–149)
VLDL Cholesterol Cal: 20 mg/dL (ref 5–40)

## 2024-08-19 LAB — VITAMIN B1: Thiamine: 99.7 nmol/L (ref 66.5–200.0)

## 2024-08-19 LAB — VITAMIN B12: Vitamin B-12: 594 pg/mL (ref 232–1245)

## 2024-08-19 LAB — VITAMIN D 25 HYDROXY (VIT D DEFICIENCY, FRACTURES): Vit D, 25-Hydroxy: 16.7 ng/mL — ABNORMAL LOW (ref 30.0–100.0)

## 2024-08-19 LAB — PSA: Prostate Specific Ag, Serum: 0.3 ng/mL (ref 0.0–4.0)

## 2024-08-20 ENCOUNTER — Other Ambulatory Visit: Payer: Self-pay

## 2024-08-20 ENCOUNTER — Encounter: Payer: Self-pay | Admitting: Internal Medicine

## 2024-08-20 ENCOUNTER — Ambulatory Visit: Payer: Self-pay | Admitting: Internal Medicine

## 2024-08-20 ENCOUNTER — Ambulatory Visit: Admitting: Internal Medicine

## 2024-08-20 VITALS — BP 132/70 | HR 101 | Ht 69.0 in | Wt 398.6 lb

## 2024-08-20 DIAGNOSIS — F332 Major depressive disorder, recurrent severe without psychotic features: Secondary | ICD-10-CM

## 2024-08-20 DIAGNOSIS — M150 Primary generalized (osteo)arthritis: Secondary | ICD-10-CM | POA: Diagnosis not present

## 2024-08-20 DIAGNOSIS — M5441 Lumbago with sciatica, right side: Secondary | ICD-10-CM | POA: Diagnosis not present

## 2024-08-20 DIAGNOSIS — E041 Nontoxic single thyroid nodule: Secondary | ICD-10-CM | POA: Diagnosis not present

## 2024-08-20 DIAGNOSIS — E1159 Type 2 diabetes mellitus with other circulatory complications: Secondary | ICD-10-CM

## 2024-08-20 DIAGNOSIS — E1169 Type 2 diabetes mellitus with other specified complication: Secondary | ICD-10-CM

## 2024-08-20 DIAGNOSIS — M48061 Spinal stenosis, lumbar region without neurogenic claudication: Secondary | ICD-10-CM

## 2024-08-20 DIAGNOSIS — E1165 Type 2 diabetes mellitus with hyperglycemia: Secondary | ICD-10-CM

## 2024-08-20 DIAGNOSIS — E559 Vitamin D deficiency, unspecified: Secondary | ICD-10-CM

## 2024-08-20 DIAGNOSIS — Z6841 Body Mass Index (BMI) 40.0 and over, adult: Secondary | ICD-10-CM

## 2024-08-20 DIAGNOSIS — E119 Type 2 diabetes mellitus without complications: Secondary | ICD-10-CM | POA: Insufficient documentation

## 2024-08-20 DIAGNOSIS — E782 Mixed hyperlipidemia: Secondary | ICD-10-CM

## 2024-08-20 DIAGNOSIS — I152 Hypertension secondary to endocrine disorders: Secondary | ICD-10-CM

## 2024-08-20 LAB — POC CREATINE & ALBUMIN,URINE
Albumin/Creatinine Ratio, Urine, POC: >300
Creatinine, POC: 100 mg/dL
Microalbumin Ur, POC: 150 mg/L

## 2024-08-20 LAB — POCT CBG (FASTING - GLUCOSE)-MANUAL ENTRY: Glucose Fasting, POC: 91 mg/dL (ref 70–99)

## 2024-08-20 MED ORDER — CELECOXIB 200 MG PO CAPS: 200.0000 mg | ORAL_CAPSULE | Freq: Every day | ORAL | 2 refills | Status: AC

## 2024-08-20 MED ORDER — ROSUVASTATIN CALCIUM 10 MG PO TABS
10.0000 mg | ORAL_TABLET | Freq: Every day | ORAL | 11 refills | Status: DC
  Filled 2024-08-20 – 2024-09-11 (×2): qty 30, 30d supply, fill #0

## 2024-08-20 MED ORDER — VITAMIN D3 1.25 MG (50000 UT) PO CAPS
1.0000 | ORAL_CAPSULE | ORAL | 3 refills | Status: AC
  Filled 2024-08-20 – 2024-09-11 (×2): qty 12, 84d supply, fill #0

## 2024-08-20 MED ORDER — PREGABALIN 25 MG PO CAPS: 25.0000 mg | ORAL_CAPSULE | Freq: Two times a day (BID) | ORAL | 2 refills | Status: DC

## 2024-08-20 NOTE — Progress Notes (Signed)
 "  Established Patient Office Visit  Subjective:  Patient ID: Sean Blake, male    DOB: November 14, 1970  Age: 53 y.o. MRN: [id number]  Chief Complaint  Patient presents with   Follow-up    10 day follow up    Patient is here today for follow up. He reports feeling fairly well today but has complaints of continued right sided sciatica pain.  Patient reports right side sciatica has been increasing as of lately. He has noticed decreased ROM and decreased exercise tolerance. He has has been doing stretches and taking Zanaflex  muscle relaxers previously prescribed to him but reports they have not been helping. He takes Advil  daily. MRI 03/2024 from [facility] showed cervical and lumbar stenosis and multiple bulging discs. He has tried oral steroids and right hip injection without relief. Patient has allergy to Gabapentin. Will trial Lyrica  for nerve pain. Discussed pain clinic referral for long term pain management. Advised patient to keep FU with [facility]. Patient is interested in water therapy. Recommend patient look into local [employer] as they have water aerobics and reduce membership rates for medicare/medicaid patients.  He has gotten his labs done and discussed them in detail today. Will start Crestor  10 mg daily and once weekly vitamin D  supplement due to low vitamin D  levels. He is also due for UAC today.  He just began checking his blood sugars today as he did not have glucose strips for his monitor until today. He reports it was around 145 fasting this morning. He has lost 16 lbs since his last visit. He reports trying to make healthier choices and reducing his portion sizes. He states he has felt as if his mood is improving since starting Prozac  and thinks it has also helped curb his appetite as well.  Patient recently found out that he was diabetic when he was in the hospital 06/2024. He needs referral to ophthalmology to get diabetic eye exam completed. Will send. He has thyroid US   scheduled for 08/21/24. He also has appointment scheduled with Pulmonology on 09/30/24. Patient has not gotten ENT, PT, GI, sleep study appointments scheduled at this time.    No other concerns at this time.   Past Medical History:  Diagnosis Date   Anxiety    Arthritis     Past Surgical History:  Procedure Laterality Date   NO PAST SURGERIES      Social History   Socioeconomic History   Marital status: Divorced    Spouse name: Not on file   Number of children: Not on file   Years of education: Not on file   Highest education level: Not on file  Occupational History   Not on file  Tobacco Use   Smoking status: Every Day    Current packs/day: 0.25    Average packs/day: 0.3 packs/day for 30.0 years (7.5 ttl pk-yrs)    Types: Cigarettes   Smokeless tobacco: Never  Vaping Use   Vaping status: Never Used  Substance and Sexual Activity   Alcohol use: Yes    Comment: 1-2 glasses of liquor 2 times per month   Drug use: Yes    Types: Marijuana    Comment: last use 03/2022   Sexual activity: Not on file  Other Topics Concern   Not on file  Social History Narrative   Not on file   Social Drivers of Health   Tobacco Use: High Risk (08/20/2024)   Patient History    Smoking Tobacco Use: Every Day  Smokeless Tobacco Use: Never    Passive Exposure: Not on file  Financial Resource Strain: Not on file  Food Insecurity: Low Risk (07/13/2024)   Received from [facility]   [facility] Food Insecurity    In the past 3 months, did you ever eat less than you felt you should because there was not enough money for food?: No     In the next 24 hours or after discharge, are you in a situation where housing, food, or transportation is a concern, or does the patient demonstrate the inability to care for self that could resul...: No  Transportation Needs: Low Risk (07/13/2024)   Received from [facility] Transportation Needs    Has lack of transportation  kept you from medical appointments, meetings, work, or from getting things needed for daily living?: No     In the next 24 hours or after discharge, are you in a situation where housing, food, or transportation is a concern, or does the patient demonstrate the inability to care for self that could resul...: No  Physical Activity: Not on file  Stress: Not on file  Social Connections: Unknown (03/08/2022)   Received from [facility]   Social Network    Social Network: Not on file  Intimate Partner Violence: Unknown (03/08/2022)   Received from [facility]   HITS    Physically Hurt: Not on file    Insult or Talk Down To: Not on file    Threaten Physical Harm: Not on file    Scream or Curse: Not on file  Depression (PHQ2-9): High Risk (08/08/2024)   Depression (PHQ2-9)    PHQ-2 Score: 13  Alcohol Screen: Low Risk (01/06/2022)   Alcohol Screen    Last Alcohol Screening Score (AUDIT): 3  Housing: Medium Risk (09/15/2021)   Housing    Last Housing Risk Score: 1  Utilities: Low Risk (07/13/2024)   Received from [facility]   [facility] Utilities    In the past 3 months, have you received a shut off notice from your electric, gas, or water company?: No  Health Literacy: Not on file    Family History  Problem Relation Age of Onset   Arthritis Mother    Other Father        unknown medical history   Arthritis Maternal Grandmother    Hypertension Maternal Grandmother    Heart attack Maternal Grandfather    Other Paternal Grandmother        unknown medical history   Arthritis Paternal Grandfather    Alzheimer's disease Paternal Grandfather     Allergies[1]  Show/hide medication list[2]  Review of Systems  Constitutional:  Positive for malaise/fatigue. Negative for chills and fever.  HENT: Negative.  Negative for congestion and sore throat.   Eyes: Negative.  Negative for blurred vision and pain.  Respiratory: Negative.  Negative for cough and shortness of breath.    Cardiovascular: Negative.  Negative for chest pain, palpitations and leg swelling.  Gastrointestinal: Negative.  Negative for abdominal pain, blood in stool, constipation, diarrhea, heartburn, melena, nausea and vomiting.  Genitourinary: Negative.  Negative for dysuria, flank pain, frequency and urgency.  Musculoskeletal:  Positive for back pain (right sided sciatica). Negative for joint pain and myalgias.  Skin: Negative.   Neurological: Negative.  Negative for dizziness, tingling, sensory change, weakness and headaches.  Endo/Heme/Allergies: Negative.   Psychiatric/Behavioral: Negative.  Negative for depression and suicidal ideas. The patient is not nervous/anxious.  Objective:   BP 132/70   Pulse (!) 101   Ht 5' 9 (1.753 m)   Wt (!) 398 lb 9.6 oz (180.8 kg)   SpO2 96%   BMI 58.86 kg/m   Vitals:   08/20/24 1407  BP: 132/70  Pulse: (!) 101  Height: 5' 9 (1.753 m)  Weight: (!) 398 lb 9.6 oz (180.8 kg)  SpO2: 96%  BMI (Calculated): 58.84    Physical Exam Vitals and nursing note reviewed.  Constitutional:      General: He is not in acute distress.    Appearance: Normal appearance. He is not ill-appearing.  HENT:     Head: Normocephalic and atraumatic.     Nose: Nose normal.     Mouth/Throat:     Mouth: Mucous membranes are moist.     Pharynx: Oropharynx is clear.  Eyes:     Conjunctiva/sclera: Conjunctivae normal.     Pupils: Pupils are equal, round, and reactive to light.  Cardiovascular:     Rate and Rhythm: Normal rate and regular rhythm.     Pulses: Normal pulses.     Heart sounds: Normal heart sounds.  Pulmonary:     Effort: Pulmonary effort is normal.     Breath sounds: Normal breath sounds. No wheezing or rhonchi.  Abdominal:     General: Bowel sounds are normal. There is no distension.     Palpations: Abdomen is soft.     Tenderness: There is no abdominal tenderness.  Musculoskeletal:        General: Normal range of motion.     Cervical back:  Normal range of motion and neck supple.     Right lower leg: No edema.     Left lower leg: No edema.  Skin:    General: Skin is warm and dry.     Capillary Refill: Capillary refill takes less than 2 seconds.  Neurological:     General: No focal deficit present.     Mental Status: He is alert and oriented to person, place, and time.     Sensory: No sensory deficit.     Motor: No weakness.  Psychiatric:        Mood and Affect: Mood normal.        Behavior: Behavior normal.        Judgment: Judgment normal.      Results for orders placed or performed in visit on 08/20/24  POCT CBG (Fasting - Glucose)  Result Value Ref Range   Glucose Fasting, POC 91 70 - 99 mg/dL  POC CREATINE & ALBUMIN,URINE  Result Value Ref Range   Microalbumin Ur, POC 150 mg/L   Creatinine, POC 100 mg/dL   Albumin/Creatinine Ratio, Urine, POC >300   Results for orders placed or performed in visit on 08/20/24  CBC and differential  Result Value Ref Range   Hemoglobin 15.0 13.5 - 17.5   HCT 45 41 - 53   Platelets 292 150 - 400 K/uL   WBC 10.5   CBC  Result Value Ref Range   RBC 4.91 3.87 - 5.11  Basic metabolic panel with GFR  Result Value Ref Range   Glucose 120    BUN 25 (A) 4 - 21   CO2 35 (A) 13 - 22   Creatinine 0.8 0.6 - 1.3   Potassium 4.7 3.5 - 5.1 mEq/L   Sodium 139 137 - 147   Chloride 97 (A) 99 - 108  Comprehensive metabolic panel with GFR  Result Value Ref  Range   eGFR 104.5   Hepatic function panel  Result Value Ref Range   Alkaline Phosphatase 76 25 - 125   ALT 69 (A) 10 - 40 U/L   AST 43 (A) 14 - 40   Bilirubin, Total 0.2   Hemoglobin A1c  Result Value Ref Range   Hemoglobin A1C 6.5   TSH  Result Value Ref Range   TSH 0.34 (A) 0.41 - 5.90    Recent Results (from the past 2160 hours)  POC Covid19/Flu A&B Antigen     Status: None   Collection Time: 06/19/24  6:10 PM  Result Value Ref Range   Influenza A Antigen, POC Negative Negative   Influenza B Antigen, POC Negative  Negative   Covid Antigen, POC Negative Negative  Hepatic function panel     Status: Abnormal   Collection Time: 07/14/24  2:40 PM  Result Value Ref Range   Alkaline Phosphatase 76 25 - 125   ALT 69 (A) 10 - 40 U/L   AST 43 (A) 14 - 40   Bilirubin, Total 0.2   Hemoglobin A1c     Status: None   Collection Time: 07/14/24  2:40 PM  Result Value Ref Range   Hemoglobin A1C 6.5   TSH     Status: Abnormal   Collection Time: 07/14/24  2:40 PM  Result Value Ref Range   TSH 0.34 (A) 0.41 - 5.90  CBC and differential     Status: None   Collection Time: 07/17/24  9:52 AM  Result Value Ref Range   Hemoglobin 15.0 13.5 - 17.5   HCT 45 41 - 53   Platelets 292 150 - 400 K/uL   WBC 10.5   CBC     Status: None   Collection Time: 07/17/24  9:52 AM  Result Value Ref Range   RBC 4.91 3.87 - 5.11  Basic metabolic panel with GFR     Status: Abnormal   Collection Time: 07/17/24  9:52 AM  Result Value Ref Range   Glucose 120    BUN 25 (A) 4 - 21   CO2 35 (A) 13 - 22   Creatinine 0.8 0.6 - 1.3   Potassium 4.7 3.5 - 5.1 mEq/L   Sodium 139 137 - 147   Chloride 97 (A) 99 - 108  Comprehensive metabolic panel with GFR     Status: None   Collection Time: 07/17/24  9:52 AM  Result Value Ref Range   eGFR 104.5   PSA     Status: None   Collection Time: 08/16/24 10:03 AM  Result Value Ref Range   Prostate Specific Ag, Serum 0.3 0.0 - 4.0 ng/mL    Comment: Roche ECLIA methodology. According to the American Urological Association, Serum PSA should decrease and remain at undetectable levels after radical prostatectomy. The AUA defines biochemical recurrence as an initial PSA value 0.2 ng/mL or greater followed by a subsequent confirmatory PSA value 0.2 ng/mL or greater. Values obtained with different assay methods or kits cannot be used interchangeably. Results cannot be interpreted as absolute evidence of the presence or absence of malignant disease.   Lipid Panel w/o Chol/HDL Ratio     Status:  Abnormal   Collection Time: 08/16/24 10:03 AM  Result Value Ref Range   Cholesterol, Total 210 (H) 100 - 199 mg/dL   Triglycerides 889 0 - 149 mg/dL   HDL 49 >60 mg/dL   VLDL Cholesterol Cal 20 5 - 40 mg/dL   LDL Chol  Calc (NIH) 141 (H) 0 - 99 mg/dL  Vitamin D  (25 hydroxy)     Status: Abnormal   Collection Time: 08/16/24 10:03 AM  Result Value Ref Range   Vit D, 25-Hydroxy 16.7 (L) 30.0 - 100.0 ng/mL    Comment: Vitamin D  deficiency has been defined by the Institute of Medicine and an Endocrine Society practice guideline as a level of serum 25-OH vitamin D  less than 20 ng/mL (1,2). The Endocrine Society went on to further define vitamin D  insufficiency as a level between 21 and 29 ng/mL (2). 1. IOM (Institute of Medicine). 2010. Dietary reference    intakes for calcium  and D. [place]: The    [employer]. 2. Holick MF, [place], Bischoff-Ferrari HA, et al.    Evaluation, treatment, and prevention of vitamin D     deficiency: an Endocrine Society clinical practice    guideline. JCEM. 2011 Jul; 96(7):1911-30.   Vitamin B12     Status: None   Collection Time: 08/16/24 10:03 AM  Result Value Ref Range   Vitamin B-12 594 232 - 1,245 pg/mL  Vitamin B1     Status: None   Collection Time: 08/16/24 10:03 AM  Result Value Ref Range   Thiamine 99.7 66.5 - 200.0 nmol/L  Folate     Status: None   Collection Time: 08/16/24 10:03 AM  Result Value Ref Range   Folate 15.3 >3.0 ng/mL    Comment: A serum folate concentration of less than 3.1 ng/mL is considered to represent clinical deficiency.   POCT CBG (Fasting - Glucose)     Status: None   Collection Time: 08/20/24  2:18 PM  Result Value Ref Range   Glucose Fasting, POC 91 70 - 99 mg/dL  POC CREATINE & ALBUMIN,URINE     Status: Abnormal   Collection Time: 08/20/24  2:20 PM  Result Value Ref Range   Microalbumin Ur, POC 150 mg/L   Creatinine, POC 100 mg/dL   Albumin/Creatinine Ratio, Urine, POC >300        Assessment & Plan:  Start Crestor  10 mg daily. Start once weekly vitamin D  supplementation. Check UAC today. Referral to ophthalmology for diabetic eye exam ordered. Reinforced healthy diet and exercise as tolerated. Fasting blood sugar goal 100-120. Will trial Lyrica  for nerve pain and switch to Celebrex  from Advil . Continue other medications as prescribed. Keep specialist appointments as scheduled. Problem List Items Addressed This Visit       Cardiovascular and Mediastinum   Hypertension associated with diabetes (HCC) - Primary     Endocrine   Thyroid nodule   Combined hyperlipidemia associated with type 2 diabetes mellitus (HCC)   Type 2 diabetes mellitus treated without insulin (HCC)   Relevant Orders   POCT CBG (Fasting - Glucose) (Completed)   POC CREATINE & ALBUMIN,URINE (Completed)   Ambulatory referral to Ophthalmology     Nervous and Auditory   Right-sided low back pain with right-sided sciatica   Relevant Medications   celecoxib  (CELEBREX ) 200 MG capsule   pregabalin  (LYRICA ) 25 MG capsule     Musculoskeletal and Integument   Primary osteoarthritis involving multiple joints   Relevant Medications   celecoxib  (CELEBREX ) 200 MG capsule     Other   Severe episode of recurrent major depressive disorder, without psychotic features (HCC)   Spinal stenosis of lumbar region   Relevant Medications   celecoxib  (CELEBREX ) 200 MG capsule   pregabalin  (LYRICA ) 25 MG capsule   Morbid obesity with body mass index (BMI) of  50.0 to 59.9 in adult [facility])    Return in about 2 weeks (around 09/03/2024).   Total time spent: 25 minutes. This time includes review of previous notes and results and patient face to face interaction during today's visit.    FERNAND FREDY RAMAN, MD  08/20/2024   This document may have been prepared by [facility] Voice Recognition software and as such may include unintentional dictation errors.      [1]  Allergies Allergen Reactions   Metformin  And Related  Diarrhea    Patient reports uncontrolled diabetes   Gabapentin Palpitations and Other (See Comments)    convulsions  [2]  Outpatient Medications Prior to Visit  Medication Sig   albuterol  (VENTOLIN  HFA) 108 (90 Base) MCG/ACT inhaler Inhale 1-2 puffs into the lungs every 6 (six) hours as needed.   benazepril  (LOTENSIN ) 20 MG tablet Take 10 mg by mouth daily.   Blood Glucose Monitoring Suppl w/Device KIT 1 each by Does not apply route daily.   Cholecalciferol  (VITAMIN D3) 1.25 MG (50000 UT) CAPS Take 1 capsule (1.25 mg total) by mouth once a week.   empagliflozin (JARDIANCE) 10 MG TABS tablet Take 1 tablet (10 mg total) by mouth daily.   FLUoxetine  (PROZAC ) 20 MG capsule Take 1 capsule (20 mg total) by mouth daily.   ibuprofen  (ADVIL ) 200 MG tablet Take 200 mg by mouth every 6 (six) hours as needed.   lisinopril -hydrochlorothiazide  (ZESTORETIC ) 20-25 MG tablet Take 1 tablet by mouth daily.   rosuvastatin  (CRESTOR ) 10 MG tablet Take 1 tablet (10 mg total) by mouth daily.   [DISCONTINUED] tiZANidine  (ZANAFLEX ) 4 MG tablet Take 1 tablet (4 mg total) by mouth every 8 (eight) hours as needed for muscle spasms. (Patient not taking: Reported on 08/20/2024)   No facility-administered medications prior to visit.   "

## 2024-08-21 ENCOUNTER — Ambulatory Visit

## 2024-09-03 ENCOUNTER — Other Ambulatory Visit: Payer: Self-pay

## 2024-09-05 ENCOUNTER — Ambulatory Visit: Admitting: Internal Medicine

## 2024-09-07 NOTE — Progress Notes (Unsigned)
 Sleep Medicine   Office Visit  Patient Name: Sean Blake DOB: Feb 24, 1971 MRN [id number]    Chief Complaint: ***  Brief History:  Sean Blake presents for an initial consult for sleep evaluation with a reported history of ***. He reports his sleep quality is ***. This is noted *** nights. The patient's bed partner reports  *** at night. The patient relates the following symptoms: *** are also present. The patient goes to sleep at *** and wakes up at ***.  Sleep quality is *** when outside home environment.  Patient has noted *** of his legs at night.  The patient  relates *** behavior during the night.  The patient *** a history of psychiatric problems. The Epworth Sleepiness Score is *** out of 24 .  The patient relates  Cardiovascular risk factors include: *** The patient reports ***    ROS  General: (-) fever, (-) chills, (-) night sweat Nose and Sinuses: (-) nasal stuffiness or itchiness, (-) postnasal drip, (-) nosebleeds, (-) sinus trouble. Mouth and Throat: (-) sore throat, (-) hoarseness. Neck: (-) swollen glands, (-) enlarged thyroid, (-) neck pain. Respiratory: *** cough, *** shortness of breath, *** wheezing. Neurologic: *** numbness, *** tingling. Psychiatric: *** anxiety, *** depression Sleep behavior: ***sleep paralysis ***hypnogogic hallucinations ***dream enactment      ***vivid dreams ***cataplexy ***night terrors ***sleep walking   Current Medication: Outpatient Encounter Medications as of 09/09/2024  Medication Sig Note   albuterol  (VENTOLIN  HFA) 108 (90 Base) MCG/ACT inhaler Inhale 1-2 puffs into the lungs every 6 (six) hours as needed.    benazepril  (LOTENSIN ) 20 MG tablet Take 10 mg by mouth daily.    Blood Glucose Monitoring Suppl w/Device KIT 1 each by Does not apply route daily.    celecoxib  (CELEBREX ) 200 MG capsule Take 1 capsule (200 mg total) by mouth daily.    Cholecalciferol  (VITAMIN D3) 1.25 MG (50000 UT) CAPS Take 1 capsule (1.25 mg total) by mouth  once a week.    empagliflozin  (JARDIANCE ) 10 MG TABS tablet Take 1 tablet (10 mg total) by mouth daily.    FLUoxetine  (PROZAC ) 20 MG capsule Take 1 capsule (20 mg total) by mouth daily.    ibuprofen  (ADVIL ) 200 MG tablet Take 200 mg by mouth every 6 (six) hours as needed. 04/19/2021: Reports taking 2 tablets (400 mg) twice daily as needed   lisinopril -hydrochlorothiazide  (ZESTORETIC ) 20-25 MG tablet Take 1 tablet by mouth daily.    pregabalin  (LYRICA ) 25 MG capsule Take 1 capsule (25 mg total) by mouth 2 (two) times daily.    rosuvastatin  (CRESTOR ) 10 MG tablet Take 1 tablet (10 mg total) by mouth daily.    No facility-administered encounter medications on file as of 09/09/2024.    Surgical History: Past Surgical History:  Procedure Laterality Date   NO PAST SURGERIES      Medical History: Past Medical History:  Diagnosis Date   Anxiety    Arthritis     Family History: Non contributory to the present illness  Social History: Social History   Socioeconomic History   Marital status: Divorced    Spouse name: Not on file   Number of children: Not on file   Years of education: Not on file   Highest education level: Not on file  Occupational History   Not on file  Tobacco Use   Smoking status: Every Day    Current packs/day: 0.25    Average packs/day: 0.3 packs/day for 30.0 years (7.5 ttl pk-yrs)  Types: Cigarettes   Smokeless tobacco: Never  Vaping Use   Vaping status: Never Used  Substance and Sexual Activity   Alcohol use: Yes    Comment: 1-2 glasses of liquor 2 times per month   Drug use: Yes    Types: Marijuana    Comment: last use 03/2022   Sexual activity: Not on file  Other Topics Concern   Not on file  Social History Narrative   Not on file   Social Drivers of Health   Tobacco Use: High Risk (08/20/2024)   Patient History    Smoking Tobacco Use: Every Day    Smokeless Tobacco Use: Never    Passive Exposure: Not on file  Financial Resource Strain: Not  on file  Food Insecurity: Low Risk (07/13/2024)   Received from [facility]   [facility] Food Insecurity    In the past 3 months, did you ever eat less than you felt you should because there was not enough money for food?: No     In the next 24 hours or after discharge, are you in a situation where housing, food, or transportation is a concern, or does the patient demonstrate the inability to care for self that could resul...: No  Transportation Needs: Low Risk (07/13/2024)   Received from [facility] Transportation Needs    Has lack of transportation kept you from medical appointments, meetings, work, or from getting things needed for daily living?: No     In the next 24 hours or after discharge, are you in a situation where housing, food, or transportation is a concern, or does the patient demonstrate the inability to care for self that could resul...: No  Physical Activity: Not on file  Stress: Not on file  Social Connections: Unknown (03/08/2022)   Received from [facility]   Social Network    Social Network: Not on file  Intimate Partner Violence: Unknown (03/08/2022)   Received from [facility]   HITS    Physically Hurt: Not on file    Insult or Talk Down To: Not on file    Threaten Physical Harm: Not on file    Scream or Curse: Not on file  Depression (PHQ2-9): High Risk (08/08/2024)   Depression (PHQ2-9)    PHQ-2 Score: 13  Alcohol Screen: Low Risk (01/06/2022)   Alcohol Screen    Last Alcohol Screening Score (AUDIT): 3  Housing: Medium Risk (09/15/2021)   Housing    Last Housing Risk Score: 1  Utilities: Low Risk (07/13/2024)   Received from [facility]   [facility] Utilities    In the past 3 months, have you received a shut off notice from your electric, gas, or water company?: No  Health Literacy: Not on file    Vital Signs: There were no vitals taken for this visit. There is no height or weight on file to calculate BMI.    Examination: General Appearance: The patient is well-developed, well-nourished, and in no distress. Neck Circumference: *** Skin: Gross inspection of skin unremarkable. Head: normocephalic, no gross deformities. Eyes: no gross deformities noted. ENT: ears appear grossly normal Neurologic: Alert and oriented. No involuntary movements.    STOP BANG RISK ASSESSMENT S (snore) Have you been told that you snore?     YES/NO   T (tired) Are you often tired, fatigued, or sleepy during the day?   YES/NO  O (obstruction) Do you stop breathing, choke, or gasp during sleep? YES/NO  P (pressure) Do you have or are you being treated for high blood pressure? YES/NO   B (BMI) Is your body index greater than 35 kg/m? YES/NO   A (age) Are you 54 years old or older? YES   N (neck) Do you have a neck circumference greater than 16 inches?   YES/NO   G (gender) Are you a male? YES   TOTAL STOP/BANG YES ANSWERS                                                                A STOP-Bang score of 2 or less is considered low risk, and a score of 5 or more is high risk for having either moderate or severe OSA. For people who score 3 or 4, doctors may need to perform further assessment to determine how likely they are to have OSA.         EPWORTH SLEEPINESS SCALE:  Scale:  (0)= no chance of dozing; (1)= slight chance of dozing; (2)= moderate chance of dozing; (3)= high chance of dozing  Chance  Situtation    Sitting and reading: ***    Watching TV: ***    Sitting Inactive in public: ***    As a passenger in car: ***      Lying down to rest: ***    Sitting and talking: ***    Sitting quielty after lunch: ***    In a car, stopped in traffic: ***   TOTAL SCORE:   *** out of 24    SLEEP STUDIES:  No studies on file.   LABS: Recent Results (from the past 2160 hours)  POC Covid19/Flu A&B Antigen     Status: None   Collection Time: 06/19/24  6:10 PM  Result Value Ref Range    Influenza A Antigen, POC Negative Negative   Influenza B Antigen, POC Negative Negative   Covid Antigen, POC Negative Negative  Hepatic function panel     Status: Abnormal   Collection Time: 07/14/24  2:40 PM  Result Value Ref Range   Alkaline Phosphatase 76 25 - 125   ALT 69 (A) 10 - 40 U/L   AST 43 (A) 14 - 40   Bilirubin, Total 0.2   Hemoglobin A1c     Status: None   Collection Time: 07/14/24  2:40 PM  Result Value Ref Range   Hemoglobin A1C 6.5   TSH     Status: Abnormal   Collection Time: 07/14/24  2:40 PM  Result Value Ref Range   TSH 0.34 (A) 0.41 - 5.90  CBC and differential     Status: None   Collection Time: 07/17/24  9:52 AM  Result Value Ref Range   Hemoglobin 15.0 13.5 - 17.5   HCT 45 41 - 53   Platelets 292 150 - 400 K/uL   WBC 10.5   CBC     Status: None   Collection Time: 07/17/24  9:52 AM  Result Value Ref Range   RBC 4.91 3.87 - 5.11  Basic metabolic panel with GFR     Status: Abnormal   Collection Time: 07/17/24  9:52 AM  Result Value Ref Range   Glucose 120    BUN 25 (A) 4 - 21   CO2 35 (A) 13 -  22   Creatinine 0.8 0.6 - 1.3   Potassium 4.7 3.5 - 5.1 mEq/L   Sodium 139 137 - 147   Chloride 97 (A) 99 - 108  Comprehensive metabolic panel with GFR     Status: None   Collection Time: 07/17/24  9:52 AM  Result Value Ref Range   eGFR 104.5   PSA     Status: None   Collection Time: 08/16/24 10:03 AM  Result Value Ref Range   Prostate Specific Ag, Serum 0.3 0.0 - 4.0 ng/mL    Comment: Roche ECLIA methodology. According to the American Urological Association, Serum PSA should decrease and remain at undetectable levels after radical prostatectomy. The AUA defines biochemical recurrence as an initial PSA value 0.2 ng/mL or greater followed by a subsequent confirmatory PSA value 0.2 ng/mL or greater. Values obtained with different assay methods or kits cannot be used interchangeably. Results cannot be interpreted as absolute evidence of the presence  or absence of malignant disease.   Lipid Panel w/o Chol/HDL Ratio     Status: Abnormal   Collection Time: 08/16/24 10:03 AM  Result Value Ref Range   Cholesterol, Total 210 (H) 100 - 199 mg/dL   Triglycerides 889 0 - 149 mg/dL   HDL 49 >60 mg/dL   VLDL Cholesterol Cal 20 5 - 40 mg/dL   LDL Chol Calc (NIH) 858 (H) 0 - 99 mg/dL  Vitamin D  (25 hydroxy)     Status: Abnormal   Collection Time: 08/16/24 10:03 AM  Result Value Ref Range   Vit D, 25-Hydroxy 16.7 (L) 30.0 - 100.0 ng/mL    Comment: Vitamin D  deficiency has been defined by the Institute of Medicine and an Endocrine Society practice guideline as a level of serum 25-OH vitamin D  less than 20 ng/mL (1,2). The Endocrine Society went on to further define vitamin D  insufficiency as a level between 21 and 29 ng/mL (2). 1. IOM (Institute of Medicine). 2010. Dietary reference    intakes for calcium  and D. [place]: The    [employer]. 2. Holick MF, Binkley [place], Bischoff-Ferrari HA, et al.    Evaluation, treatment, and prevention of vitamin D     deficiency: an Endocrine Society clinical practice    guideline. JCEM. 2011 Jul; 96(7):1911-30.   Vitamin B12     Status: None   Collection Time: 08/16/24 10:03 AM  Result Value Ref Range   Vitamin B-12 594 232 - 1,245 pg/mL  Vitamin B1     Status: None   Collection Time: 08/16/24 10:03 AM  Result Value Ref Range   Thiamine 99.7 66.5 - 200.0 nmol/L  Folate     Status: None   Collection Time: 08/16/24 10:03 AM  Result Value Ref Range   Folate 15.3 >3.0 ng/mL    Comment: A serum folate concentration of less than 3.1 ng/mL is considered to represent clinical deficiency.   POCT CBG (Fasting - Glucose)     Status: None   Collection Time: 08/20/24  2:18 PM  Result Value Ref Range   Glucose Fasting, POC 91 70 - 99 mg/dL  POC CREATINE & ALBUMIN,URINE     Status: Abnormal   Collection Time: 08/20/24  2:20 PM  Result Value Ref Range   Microalbumin Ur, POC 150 mg/L    Creatinine, POC 100 mg/dL   Albumin/Creatinine Ratio, Urine, POC >300     Radiology: No results found.  No results found.  No results found.    Assessment and Plan: Patient Active  Problem List   Diagnosis Date Noted   Type 2 diabetes mellitus treated without insulin (HCC) 08/20/2024   Spinal stenosis of lumbar region 08/20/2024   Morbid obesity with body mass index (BMI) of 50.0 to 59.9 in adult (HCC) 08/20/2024   Impaired mobility and personal care 08/08/2024   Right-sided low back pain with right-sided sciatica 08/08/2024   Bloody stool 08/08/2024   Severe episode of recurrent major depressive disorder, without psychotic features (HCC) 08/08/2024   Idiopathic sleep related nonobstructive alveolar hypoventilation 08/08/2024   Hypertension associated with diabetes (HCC) 08/08/2024   Prostate cancer screening 08/08/2024   Combined hyperlipidemia associated with type 2 diabetes mellitus (HCC) 08/08/2024   Vitamin D  deficiency 08/08/2024   B12 deficiency 08/08/2024   Alcohol abuse 08/08/2024   Obesity hypoventilation syndrome (HCC) 07/16/2024   Thyroid nodule 07/15/2024   Lymphedema 07/15/2024   Diabetes (HCC) 07/15/2024   Morbid obesity (HCC) 07/13/2024   Acute respiratory failure with hypoxia and hypercapnia (HCC) 07/13/2024   Tobacco abuse 07/13/2024   Lumbar radiculopathy 04/08/2024   Primary osteoarthritis of right hip 07/14/2023   Primary osteoarthritis involving multiple joints 07/12/2022   Benign neoplasm of right clavicle 07/12/2022   Chronic pain of left knee 07/12/2022   Chronic back pain 07/12/2022   Chronic pain of both shoulders 07/12/2022   DDD (degenerative disc disease), lumbar 07/12/2022   Primary osteoarthritis of left knee 07/12/2022   Morbid obesity with BMI of 45.0-49.9, adult (HCC) 07/12/2022   Pre-diabetes 04/12/2022   Proteinuria 04/12/2022   Elevated lipids 04/12/2022   Elevated blood pressure reading 03/09/2022   Health care maintenance  09/22/2021   Mass of soft tissue of neck 09/15/2021   Arthritis 09/15/2021   Anxiety and depression 09/15/2021   Obesity 09/15/2021     PLAN OSA:   Patient evaluation suggests high risk of sleep disordered breathing due to *** Patient has comorbid cardiovascular risk factors including: *** which could be exacerbated by pathologic sleep-disordered breathing.  Suggest: *** to assess/treat the patient's sleep disordered breathing. The patient was also counselled on *** to optimize sleep health.  PLAN hypersomnia:  Patient evaluation suggests significant daytime hypersomnia.  The Epworth Sleepiness Score is elevated at *** out of 24. Patient *** drowsy driving. The patient *** MVA due to sleepiness.  The patient *** restless leg symptoms which exacerbate *** for *** nights per week. The patient *** periodic limb movements which exacerbate ***  for *** nights per week. Suggest: ***  Also suggest ***  PLAN insomnia:  Patient evaluation suggests *** insomnia. This is a chronic disorder. This has been a concern for *** and causes impaired daytime functioning. The patient exhibits comorbid ***  The history *** suggest the insomnia predates the use of hypnotic medications. The symptoms *** with the discontinuation of these medications. There is no obvious medical, psychiatric or pharmacologic abuse issues ot account for the insomnia.  Treatment recommendations include: *** The patient should maintain a sleep log and calculate total sleep time for 1-2 weeks. Set bed and wake times for achieve 85% sleep efficiency for one week. Once this is achieved  time in bed can be gradually increased. A pharmacologic treatment approach would include a trial of *** for the next ***  months. During this time the patient is to maintain a sleep diary to track progress.    ***  General Counseling: I have discussed the findings of the evaluation and examination with [facility].  I have also discussed any further  diagnostic evaluation  thatmay be needed or ordered today. Arris verbalizes understanding of the findings of todays visit. We also reviewed his medications today and discussed drug interactions and side effects including but not limited excessive drowsiness and altered mental states. We also discussed that there is always a risk not just to him but also people around him. he has been encouraged to call the office with any questions or concerns that should arise related to todays visit.  No orders of the defined types were placed in this encounter.       I have personally obtained a history, evaluated the patient, evaluated pertinent data, formulated the assessment and plan and placed orders.    Elfreda DELENA Bathe, MD [facility] Diplomate ABMS Pulmonary and Critical Care Medicine Sleep medicine

## 2024-09-09 ENCOUNTER — Ambulatory Visit

## 2024-09-11 ENCOUNTER — Other Ambulatory Visit: Payer: Self-pay

## 2024-09-12 ENCOUNTER — Encounter: Payer: Self-pay | Admitting: Internal Medicine

## 2024-09-12 ENCOUNTER — Other Ambulatory Visit: Payer: Self-pay

## 2024-09-12 ENCOUNTER — Ambulatory Visit (INDEPENDENT_AMBULATORY_CARE_PROVIDER_SITE_OTHER): Admitting: Internal Medicine

## 2024-09-12 VITALS — BP 140/86 | HR 100 | Ht 69.0 in | Wt >= 6400 oz

## 2024-09-12 DIAGNOSIS — E041 Nontoxic single thyroid nodule: Secondary | ICD-10-CM | POA: Diagnosis not present

## 2024-09-12 DIAGNOSIS — E1159 Type 2 diabetes mellitus with other circulatory complications: Secondary | ICD-10-CM | POA: Diagnosis not present

## 2024-09-12 DIAGNOSIS — Z6841 Body Mass Index (BMI) 40.0 and over, adult: Secondary | ICD-10-CM | POA: Diagnosis not present

## 2024-09-12 DIAGNOSIS — E782 Mixed hyperlipidemia: Secondary | ICD-10-CM | POA: Diagnosis not present

## 2024-09-12 DIAGNOSIS — I152 Hypertension secondary to endocrine disorders: Secondary | ICD-10-CM

## 2024-09-12 DIAGNOSIS — E1165 Type 2 diabetes mellitus with hyperglycemia: Secondary | ICD-10-CM

## 2024-09-12 DIAGNOSIS — E119 Type 2 diabetes mellitus without complications: Secondary | ICD-10-CM

## 2024-09-12 DIAGNOSIS — F332 Major depressive disorder, recurrent severe without psychotic features: Secondary | ICD-10-CM | POA: Diagnosis not present

## 2024-09-12 DIAGNOSIS — E1169 Type 2 diabetes mellitus with other specified complication: Secondary | ICD-10-CM

## 2024-09-12 LAB — POCT CBG (FASTING - GLUCOSE)-MANUAL ENTRY: Glucose Fasting, POC: 121 mg/dL — AB (ref 70–99)

## 2024-09-12 MED ORDER — FLUOXETINE HCL 40 MG PO CAPS: 40.0000 mg | ORAL_CAPSULE | Freq: Every day | ORAL | 2 refills | Status: DC

## 2024-09-12 MED ORDER — LISINOPRIL 20 MG PO TABS: 20.0000 mg | ORAL_TABLET | Freq: Every day | ORAL | 3 refills | Status: AC

## 2024-09-12 MED ORDER — ROSUVASTATIN CALCIUM 10 MG PO TABS: 10.0000 mg | ORAL_TABLET | Freq: Every day | ORAL | 3 refills | Status: AC

## 2024-09-12 MED ORDER — EMPAGLIFLOZIN 25 MG PO TABS: 25.0000 mg | ORAL_TABLET | Freq: Every day | ORAL | 6 refills | Status: AC

## 2024-09-12 MED ORDER — LISINOPRIL-HYDROCHLOROTHIAZIDE 20-25 MG PO TABS: 1.0000 | ORAL_TABLET | Freq: Every day | ORAL | 3 refills | Status: AC

## 2024-09-12 NOTE — Progress Notes (Signed)
 "  Established Patient Office Visit  Subjective:  Patient ID: Sean Blake, male    DOB: [birth year]  Age: 54 y.o. MRN: [id number]  Chief Complaint  Patient presents with   Follow-up    Patient is here today for follow up. He reports doing fairly well since he was here last.  His thyroid US  has not been completed that was previously scheduled for 08/21/24. He has not gotten this rescheduled yet. Advised patient to get thyroid US  rescheduled and specialist scheduled as referrals have been sent during previous visits.  He reports has not gotten his medical supply chair. Will get this re-faxed to medical supply store. Patient will be contacted by medical supply store when he can come pick up the shower chair.  He was recently started on Lyrica  and reports still needing to take Ibuprofen  every other day. Patient has since started taking Celebrex  daily. Advised patient to stop Ibuprofen  as it is the same family as Celebrex  and can not be taken together. Patient also reports he is having issues with sustaining erection and not able to have successful intercourse. Discussed uncontrolled blood pressure, high cholesterol and other factors such as stress or medication side effects can contribute to ED. Patient is interested in medication options. Upon further discussion with the patient, will reassess ED medication at FU appointment once his BP and cholesterol are under better control. Due to elevated BP will increase lisinopril  to 40 mg daily with additional 20 mg tablet. Patient has benazepril  on his medication list. He is unsure if he is taking it or not. Will stop that medication.   Patients Crestor  was sent to the wrong pharmacy. New prescription sent. Recommend patient increase Jardiance  at this time to 25 mg daily dose. Reinforced eating healthy diet and staying active to promote overall improvement in chronic disease management and promote weight loss. Will recheck TSH and BMP today.  Patient  states he has noticed slight improvement in his anxiety and depression since starting Prozac  but is not where he wants to be at this time. Will increase Prozac  to 40 mg daily.    No other concerns at this time.   Past Medical History:  Diagnosis Date   Anxiety    Arthritis     Past Surgical History:  Procedure Laterality Date   NO PAST SURGERIES      Social History   Socioeconomic History   Marital status: Divorced    Spouse name: Not on file   Number of children: Not on file   Years of education: Not on file   Highest education level: Not on file  Occupational History   Not on file  Tobacco Use   Smoking status: Every Day    Current packs/day: 0.25    Average packs/day: 0.3 packs/day for 30.0 years (7.5 ttl pk-yrs)    Types: Cigarettes   Smokeless tobacco: Never  Vaping Use   Vaping status: Never Used  Substance and Sexual Activity   Alcohol use: Yes    Comment: 1-2 glasses of liquor 2 times per month   Drug use: Yes    Types: Marijuana    Comment: last use 03/2022   Sexual activity: Not on file  Other Topics Concern   Not on file  Social History Narrative   Not on file   Social Drivers of Health   Tobacco Use: High Risk (09/12/2024)   Patient History    Smoking Tobacco Use: Every Day    Smokeless Tobacco Use: Never  Passive Exposure: Not on file  Financial Resource Strain: Not on file  Food Insecurity: Low Risk (07/13/2024)   Received from [facility]   [facility] Food Insecurity    In the past 3 months, did you ever eat less than you felt you should because there was not enough money for food?: No     In the next 24 hours or after discharge, are you in a situation where housing, food, or transportation is a concern, or does the patient demonstrate the inability to care for self that could resul...: No  Transportation Needs: Low Risk (07/13/2024)   Received from [facility] Transportation Needs    Has lack of  transportation kept you from medical appointments, meetings, work, or from getting things needed for daily living?: No     In the next 24 hours or after discharge, are you in a situation where housing, food, or transportation is a concern, or does the patient demonstrate the inability to care for self that could resul...: No  Physical Activity: Not on file  Stress: Not on file  Social Connections: Unknown (03/08/2022)   Received from [facility]   Social Network    Social Network: Not on file  Intimate Partner Violence: Unknown (03/08/2022)   Received from [facility]   HITS    Physically Hurt: Not on file    Insult or Talk Down To: Not on file    Threaten Physical Harm: Not on file    Scream or Curse: Not on file  Depression (PHQ2-9): High Risk (08/08/2024)   Depression (PHQ2-9)    PHQ-2 Score: 13  Alcohol Screen: Low Risk (01/06/2022)   Alcohol Screen    Last Alcohol Screening Score (AUDIT): 3  Housing: Medium Risk (09/15/2021)   Housing    Last Housing Risk Score: 1  Utilities: Low Risk (07/13/2024)   Received from [facility]   [facility] Utilities    In the past 3 months, have you received a shut off notice from your electric, gas, or water company?: No  Health Literacy: Not on file    Family History  Problem Relation Age of Onset   Arthritis Mother    Other Father        unknown medical history   Arthritis Maternal Grandmother    Hypertension Maternal Grandmother    Heart attack Maternal Grandfather    Other Paternal Grandmother        unknown medical history   Arthritis Paternal Grandfather    Alzheimer's disease Paternal Grandfather     Allergies[1]  Show/hide medication list[2]  Review of Systems  Constitutional: Negative.  Negative for chills, fever and malaise/fatigue.  HENT: Negative.  Negative for congestion and sore throat.   Eyes: Negative.  Negative for blurred vision and pain.  Respiratory: Negative.  Negative for cough and shortness of  breath.   Cardiovascular: Negative.  Negative for chest pain, palpitations and leg swelling.  Gastrointestinal: Negative.  Negative for abdominal pain, blood in stool, constipation, diarrhea, heartburn, melena, nausea and vomiting.  Genitourinary:  Negative for dysuria, flank pain, frequency and urgency.       E.D.  Musculoskeletal: Negative.  Negative for joint pain and myalgias.  Skin: Negative.   Neurological: Negative.  Negative for dizziness, tingling, sensory change, weakness and headaches.  Endo/Heme/Allergies: Negative.   Psychiatric/Behavioral:  Positive for depression. Negative for suicidal ideas. The patient is nervous/anxious.        Objective:  BP (!) 140/86   Pulse 100   Ht 5' 9 (1.753 m)   Wt (!) 407 lb (184.6 kg)   SpO2 96%   BMI 60.10 kg/m   Vitals:   09/12/24 1405  BP: (!) 140/86  Pulse: 100  Height: 5' 9 (1.753 m)  Weight: (!) 407 lb (184.6 kg)  SpO2: 96%  BMI (Calculated): 60.08    Physical Exam Vitals and nursing note reviewed.  Constitutional:      General: He is not in acute distress.    Appearance: Normal appearance. He is not ill-appearing.  HENT:     Head: Normocephalic and atraumatic.     Nose: Nose normal.     Mouth/Throat:     Mouth: Mucous membranes are moist.     Pharynx: Oropharynx is clear.  Eyes:     Conjunctiva/sclera: Conjunctivae normal.     Pupils: Pupils are equal, round, and reactive to light.  Cardiovascular:     Rate and Rhythm: Normal rate and regular rhythm.     Pulses: Normal pulses.     Heart sounds: Normal heart sounds.  Pulmonary:     Effort: Pulmonary effort is normal.     Breath sounds: Normal breath sounds. No wheezing or rhonchi.  Abdominal:     General: Bowel sounds are normal. There is no distension.     Palpations: Abdomen is soft.     Tenderness: There is no abdominal tenderness.  Musculoskeletal:        General: Normal range of motion.     Cervical back: Normal range of motion and neck supple.      Right lower leg: 2+ Pitting Edema present.     Left lower leg: 2+ Pitting Edema present.  Skin:    General: Skin is warm and dry.     Capillary Refill: Capillary refill takes less than 2 seconds.  Neurological:     General: No focal deficit present.     Mental Status: He is alert and oriented to person, place, and time.     Sensory: No sensory deficit.     Motor: No weakness.  Psychiatric:        Mood and Affect: Mood normal.        Behavior: Behavior normal.        Judgment: Judgment normal.      Results for orders placed or performed in visit on 09/12/24  POCT CBG (Fasting - Glucose)  Result Value Ref Range   Glucose Fasting, POC 121 (A) 70 - 99 mg/dL    Recent Results (from the past 2160 hours)  POC Covid19/Flu A&B Antigen     Status: None   Collection Time: 06/19/24  6:10 PM  Result Value Ref Range   Influenza A Antigen, POC Negative Negative   Influenza B Antigen, POC Negative Negative   Covid Antigen, POC Negative Negative  Hepatic function panel     Status: Abnormal   Collection Time: 07/14/24  2:40 PM  Result Value Ref Range   Alkaline Phosphatase 76 25 - 125   ALT 69 (A) 10 - 40 U/L   AST 43 (A) 14 - 40   Bilirubin, Total 0.2   Hemoglobin A1c     Status: None   Collection Time: 07/14/24  2:40 PM  Result Value Ref Range   Hemoglobin A1C 6.5   TSH     Status: Abnormal   Collection Time: 07/14/24  2:40 PM  Result Value Ref Range   TSH 0.34 (A) 0.41 -  5.90  CBC and differential     Status: None   Collection Time: 07/17/24  9:52 AM  Result Value Ref Range   Hemoglobin 15.0 13.5 - 17.5   HCT 45 41 - 53   Platelets 292 150 - 400 K/uL   WBC 10.5   CBC     Status: None   Collection Time: 07/17/24  9:52 AM  Result Value Ref Range   RBC 4.91 3.87 - 5.11  Basic metabolic panel with GFR     Status: Abnormal   Collection Time: 07/17/24  9:52 AM  Result Value Ref Range   Glucose 120    BUN 25 (A) 4 - 21   CO2 35 (A) 13 - 22   Creatinine 0.8 0.6 - 1.3    Potassium 4.7 3.5 - 5.1 mEq/L   Sodium 139 137 - 147   Chloride 97 (A) 99 - 108  Comprehensive metabolic panel with GFR     Status: None   Collection Time: 07/17/24  9:52 AM  Result Value Ref Range   eGFR 104.5   PSA     Status: None   Collection Time: 08/16/24 10:03 AM  Result Value Ref Range   Prostate Specific Ag, Serum 0.3 0.0 - 4.0 ng/mL    Comment: Roche ECLIA methodology. According to the American Urological Association, Serum PSA should decrease and remain at undetectable levels after radical prostatectomy. The AUA defines biochemical recurrence as an initial PSA value 0.2 ng/mL or greater followed by a subsequent confirmatory PSA value 0.2 ng/mL or greater. Values obtained with different assay methods or kits cannot be used interchangeably. Results cannot be interpreted as absolute evidence of the presence or absence of malignant disease.   Lipid Panel w/o Chol/HDL Ratio     Status: Abnormal   Collection Time: 08/16/24 10:03 AM  Result Value Ref Range   Cholesterol, Total 210 (H) 100 - 199 mg/dL   Triglycerides 889 0 - 149 mg/dL   HDL 49 >60 mg/dL   VLDL Cholesterol Cal 20 5 - 40 mg/dL   LDL Chol Calc (NIH) 858 (H) 0 - 99 mg/dL  Vitamin D  (25 hydroxy)     Status: Abnormal   Collection Time: 08/16/24 10:03 AM  Result Value Ref Range   Vit D, 25-Hydroxy 16.7 (L) 30.0 - 100.0 ng/mL    Comment: Vitamin D  deficiency has been defined by the Institute of Medicine and an Endocrine Society practice guideline as a level of serum 25-OH vitamin D  less than 20 ng/mL (1,2). The Endocrine Society went on to further define vitamin D  insufficiency as a level between 21 and 29 ng/mL (2). 1. IOM (Institute of Medicine). 2010. Dietary reference    intakes for calcium  and D. [place]: The    [employer]. 2. Holick MF, Binkley [place]ien[place], Bischoff-Ferrari HA, et al.    Evaluation, treatment, and prevention of vitamin D     deficiency: an Endocrine Society clinical practice     guideline. JCEM. 2011 Jul; 96(7):1911-30.   Vitamin B12     Status: None   Collection Time: 08/16/24 10:03 AM  Result Value Ref Range   Vitamin B-12 594 232 - 1,245 pg/mL  Vitamin B1     Status: None   Collection Time: 08/16/24 10:03 AM  Result Value Ref Range   Thiamine 99.7 66.5 - 200.0 nmol/L  Folate     Status: None   Collection Time: 08/16/24 10:03 AM  Result Value Ref Range   Folate 15.3 >3.0  ng/mL    Comment: A serum folate concentration of less than 3.1 ng/mL is considered to represent clinical deficiency.   POCT CBG (Fasting - Glucose)     Status: None   Collection Time: 08/20/24  2:18 PM  Result Value Ref Range   Glucose Fasting, POC 91 70 - 99 mg/dL  POC CREATINE & ALBUMIN,URINE     Status: Abnormal   Collection Time: 08/20/24  2:20 PM  Result Value Ref Range   Microalbumin Ur, POC 150 mg/L   Creatinine, POC 100 mg/dL   Albumin/Creatinine Ratio, Urine, POC >300   POCT CBG (Fasting - Glucose)     Status: Abnormal   Collection Time: 09/12/24  2:11 PM  Result Value Ref Range   Glucose Fasting, POC 121 (A) 70 - 99 mg/dL      Assessment & Plan:  Stop benazepril  daily. Increase prozac  to 40 mg daily. Lisinopril  20 mg daily in addition to lisinopril  with hydrochlorothiazide . Increase jardiance  25 mg daily. Check TSH and Bmp today. Continue other medications as prescribed. Reinforced healthy diet and exercise as tolerated. Reinforced need to get specialist appointments scheduled and get thyroid US  completed as soon as possible. Problem List Items Addressed This Visit       Cardiovascular and Mediastinum   Hypertension associated with diabetes (HCC) - Primary   Relevant Medications   empagliflozin  (JARDIANCE ) 25 MG TABS tablet   rosuvastatin  (CRESTOR ) 10 MG tablet   lisinopril  (ZESTRIL ) 20 MG tablet   lisinopril -hydrochlorothiazide  (ZESTORETIC ) 20-25 MG tablet   Other Relevant Orders   Basic metabolic panel with GFR     Endocrine   Thyroid nodule   Relevant Orders    TSH+T4F+T3Free   Combined hyperlipidemia associated with type 2 diabetes mellitus (HCC)   Relevant Medications   empagliflozin  (JARDIANCE ) 25 MG TABS tablet   rosuvastatin  (CRESTOR ) 10 MG tablet   lisinopril  (ZESTRIL ) 20 MG tablet   lisinopril -hydrochlorothiazide  (ZESTORETIC ) 20-25 MG tablet   Type 2 diabetes mellitus treated without insulin (HCC)   Relevant Medications   empagliflozin  (JARDIANCE ) 25 MG TABS tablet   rosuvastatin  (CRESTOR ) 10 MG tablet   lisinopril  (ZESTRIL ) 20 MG tablet   lisinopril -hydrochlorothiazide  (ZESTORETIC ) 20-25 MG tablet   Other Relevant Orders   POCT CBG (Fasting - Glucose) (Completed)     Other   Severe episode of recurrent major depressive disorder, without psychotic features (HCC)   Relevant Medications   FLUoxetine  (PROZAC ) 40 MG capsule   Body mass index (BMI) of 60.0 to 69.9 in adult [facility])   Relevant Medications   empagliflozin  (JARDIANCE ) 25 MG TABS tablet    Return in about 6 weeks (around 10/24/2024).   Total time spent: 30 minutes. This time includes review of previous notes and results and patient face to face interaction during today's visit.    FERNAND FREDY RAMAN, MD  09/12/2024   This document may have been prepared by [facility] Voice Recognition software and as such may include unintentional dictation errors.      [1]  Allergies Allergen Reactions   Metformin  And Related Diarrhea    Patient reports uncontrolled diabetes   Gabapentin Palpitations and Other (See Comments)    convulsions  [2]  Outpatient Medications Prior to Visit  Medication Sig   albuterol  (VENTOLIN  HFA) 108 (90 Base) MCG/ACT inhaler Inhale 1-2 puffs into the lungs every 6 (six) hours as needed.   Blood Glucose Monitoring Suppl w/Device KIT 1 each by Does not apply route daily.   celecoxib  (CELEBREX ) 200 MG capsule Take 1  capsule (200 mg total) by mouth daily.   Cholecalciferol  (VITAMIN D3) 1.25 MG (50000 UT) CAPS Take 1 capsule (1.25 mg total) by mouth once a  week.   pregabalin  (LYRICA ) 25 MG capsule Take 1 capsule (25 mg total) by mouth 2 (two) times daily.   [DISCONTINUED] benazepril  (LOTENSIN ) 20 MG tablet Take 10 mg by mouth daily.   [DISCONTINUED] empagliflozin  (JARDIANCE ) 10 MG TABS tablet Take 1 tablet (10 mg total) by mouth daily.   [DISCONTINUED] FLUoxetine  (PROZAC ) 20 MG capsule Take 1 capsule (20 mg total) by mouth daily.   [DISCONTINUED] ibuprofen  (ADVIL ) 200 MG tablet Take 200 mg by mouth every 6 (six) hours as needed.   [DISCONTINUED] lisinopril -hydrochlorothiazide  (ZESTORETIC ) 20-25 MG tablet Take 1 tablet by mouth daily.   [DISCONTINUED] rosuvastatin  (CRESTOR ) 10 MG tablet Take 1 tablet (10 mg total) by mouth daily.   No facility-administered medications prior to visit.   "

## 2024-09-13 ENCOUNTER — Ambulatory Visit: Payer: Self-pay | Admitting: Internal Medicine

## 2024-09-13 LAB — BASIC METABOLIC PANEL WITH GFR
BUN/Creatinine Ratio: 15 (ref 9–20)
BUN: 12 mg/dL (ref 6–24)
CO2: 23 mmol/L (ref 20–29)
Calcium: 9.5 mg/dL (ref 8.7–10.2)
Chloride: 101 mmol/L (ref 96–106)
Creatinine, Ser: 0.8 mg/dL (ref 0.76–1.27)
Glucose: 70 mg/dL (ref 70–99)
Potassium: 4.4 mmol/L (ref 3.5–5.2)
Sodium: 142 mmol/L (ref 134–144)
eGFR: 105 mL/min/1.73

## 2024-09-13 LAB — TSH+T4F+T3FREE
Free T4: 0.98 ng/dL (ref 0.82–1.77)
T3, Free: 3.4 pg/mL (ref 2.0–4.4)
TSH: 0.971 u[IU]/mL (ref 0.450–4.500)

## 2024-09-17 NOTE — Progress Notes (Signed)
 Patient notified

## 2024-09-18 ENCOUNTER — Other Ambulatory Visit: Payer: Self-pay | Admitting: Otolaryngology

## 2024-09-18 DIAGNOSIS — E041 Nontoxic single thyroid nodule: Secondary | ICD-10-CM

## 2024-09-23 ENCOUNTER — Other Ambulatory Visit: Payer: Self-pay

## 2024-09-25 ENCOUNTER — Ambulatory Visit
Admission: RE | Admit: 2024-09-25 | Discharge: 2024-09-25 | Disposition: A | Source: Ambulatory Visit | Attending: Otolaryngology | Admitting: Otolaryngology

## 2024-09-25 DIAGNOSIS — E041 Nontoxic single thyroid nodule: Secondary | ICD-10-CM

## 2024-09-27 ENCOUNTER — Other Ambulatory Visit: Payer: Self-pay

## 2024-09-27 ENCOUNTER — Other Ambulatory Visit: Payer: Self-pay | Admitting: Otolaryngology

## 2024-09-27 DIAGNOSIS — E041 Nontoxic single thyroid nodule: Secondary | ICD-10-CM

## 2024-09-27 DIAGNOSIS — M48061 Spinal stenosis, lumbar region without neurogenic claudication: Secondary | ICD-10-CM

## 2024-09-27 DIAGNOSIS — F332 Major depressive disorder, recurrent severe without psychotic features: Secondary | ICD-10-CM

## 2024-09-27 DIAGNOSIS — M5441 Lumbago with sciatica, right side: Secondary | ICD-10-CM

## 2024-09-27 MED ORDER — PREGABALIN 25 MG PO CAPS: 25.0000 mg | ORAL_CAPSULE | Freq: Two times a day (BID) | ORAL | 2 refills | Status: AC

## 2024-09-27 MED ORDER — FLUOXETINE HCL 40 MG PO CAPS: 40.0000 mg | ORAL_CAPSULE | Freq: Every day | ORAL | 2 refills | Status: AC

## 2024-09-30 ENCOUNTER — Ambulatory Visit: Admitting: Internal Medicine

## 2024-10-01 ENCOUNTER — Telehealth: Payer: Self-pay

## 2024-10-02 ENCOUNTER — Ambulatory Visit

## 2024-10-04 ENCOUNTER — Telehealth: Payer: Self-pay

## 2024-10-04 NOTE — Telephone Encounter (Signed)
 Patient for US  guided FNA RT Inferior thyroid nodule biopsy on Mon 10/07/24, I called and spoke with the patient on the phone and gave pre-procedure instructions. Pt was made aware to be here at 2p and check in at the [facility] registration desk. Pt stated understanding. Called 10/04/24

## 2024-10-07 ENCOUNTER — Ambulatory Visit

## 2024-10-24 ENCOUNTER — Ambulatory Visit: Admitting: Internal Medicine

## 2024-10-29 ENCOUNTER — Ambulatory Visit: Admitting: Internal Medicine
